# Patient Record
Sex: Female | Born: 1965 | Race: Black or African American | Hispanic: No | Marital: Single | Smoking: Never smoker
Health system: Southern US, Community
[De-identification: ages and names within clinical notes are randomized; demographics above are authoritative.]

## PROBLEM LIST (undated history)

## (undated) DIAGNOSIS — N62 Hypertrophy of breast: Secondary | ICD-10-CM

## (undated) DIAGNOSIS — IMO0002 Reserved for concepts with insufficient information to code with codable children: Secondary | ICD-10-CM

## (undated) DIAGNOSIS — Z973 Presence of spectacles and contact lenses: Secondary | ICD-10-CM

## (undated) DIAGNOSIS — Z87410 Personal history of cervical dysplasia: Secondary | ICD-10-CM

## (undated) DIAGNOSIS — I10 Essential (primary) hypertension: Secondary | ICD-10-CM

## (undated) DIAGNOSIS — A749 Chlamydial infection, unspecified: Secondary | ICD-10-CM

## (undated) DIAGNOSIS — N891 Moderate vaginal dysplasia: Secondary | ICD-10-CM

## (undated) DIAGNOSIS — N840 Polyp of corpus uteri: Secondary | ICD-10-CM

## (undated) DIAGNOSIS — Z8742 Personal history of other diseases of the female genital tract: Secondary | ICD-10-CM

## (undated) DIAGNOSIS — N939 Abnormal uterine and vaginal bleeding, unspecified: Secondary | ICD-10-CM

## (undated) DIAGNOSIS — T7840XA Allergy, unspecified, initial encounter: Secondary | ICD-10-CM

## (undated) DIAGNOSIS — E785 Hyperlipidemia, unspecified: Secondary | ICD-10-CM

## (undated) HISTORY — DX: Allergy, unspecified, initial encounter: T78.40XA

## (undated) HISTORY — DX: Moderate vaginal dysplasia: N89.1

## (undated) HISTORY — PX: TUBAL LIGATION: SHX77

## (undated) HISTORY — DX: Hyperlipidemia, unspecified: E78.5

## (undated) HISTORY — DX: Essential (primary) hypertension: I10

## (undated) HISTORY — PX: OTHER SURGICAL HISTORY: SHX169

## (undated) HISTORY — PX: ENDOMETRIAL BIOPSY: PRO73

## (undated) HISTORY — DX: Reserved for concepts with insufficient information to code with codable children: IMO0002

## (undated) HISTORY — PX: COLONOSCOPY: SHX174

## (undated) HISTORY — DX: Chlamydial infection, unspecified: A74.9

---

## 1997-09-23 HISTORY — PX: TUBAL LIGATION: SHX77

## 1998-03-01 ENCOUNTER — Other Ambulatory Visit: Admission: RE | Admit: 1998-03-01 | Discharge: 1998-03-01 | Payer: Self-pay | Admitting: Obstetrics & Gynecology

## 1998-05-15 ENCOUNTER — Inpatient Hospital Stay (HOSPITAL_COMMUNITY): Admission: AD | Admit: 1998-05-15 | Discharge: 1998-05-15 | Payer: Self-pay | Admitting: Obstetrics & Gynecology

## 1998-09-11 ENCOUNTER — Inpatient Hospital Stay (HOSPITAL_COMMUNITY): Admission: AD | Admit: 1998-09-11 | Discharge: 1998-09-13 | Payer: Self-pay | Admitting: Obstetrics and Gynecology

## 2005-09-23 DIAGNOSIS — I10 Essential (primary) hypertension: Secondary | ICD-10-CM

## 2005-09-23 HISTORY — DX: Essential (primary) hypertension: I10

## 2008-01-18 ENCOUNTER — Encounter: Admission: RE | Admit: 2008-01-18 | Discharge: 2008-01-18 | Payer: Self-pay | Admitting: Family Medicine

## 2008-01-29 ENCOUNTER — Encounter: Admission: RE | Admit: 2008-01-29 | Discharge: 2008-01-29 | Payer: Self-pay | Admitting: Family Medicine

## 2009-02-27 ENCOUNTER — Encounter: Admission: RE | Admit: 2009-02-27 | Discharge: 2009-02-27 | Payer: Self-pay | Admitting: Family Medicine

## 2010-06-25 ENCOUNTER — Encounter: Admission: RE | Admit: 2010-06-25 | Discharge: 2010-06-25 | Payer: Self-pay | Admitting: Family Medicine

## 2010-09-23 DIAGNOSIS — Z8619 Personal history of other infectious and parasitic diseases: Secondary | ICD-10-CM

## 2010-09-23 DIAGNOSIS — A749 Chlamydial infection, unspecified: Secondary | ICD-10-CM

## 2010-09-23 HISTORY — DX: Personal history of other infectious and parasitic diseases: Z86.19

## 2010-09-23 HISTORY — DX: Chlamydial infection, unspecified: A74.9

## 2010-10-14 ENCOUNTER — Encounter: Payer: Self-pay | Admitting: Family Medicine

## 2011-05-25 DIAGNOSIS — IMO0002 Reserved for concepts with insufficient information to code with codable children: Secondary | ICD-10-CM

## 2011-05-25 HISTORY — DX: Reserved for concepts with insufficient information to code with codable children: IMO0002

## 2011-06-05 ENCOUNTER — Ambulatory Visit (AMBULATORY_SURGERY_CENTER): Payer: 59 | Admitting: *Deleted

## 2011-06-05 VITALS — Ht 65.5 in | Wt 226.0 lb

## 2011-06-05 DIAGNOSIS — Z1211 Encounter for screening for malignant neoplasm of colon: Secondary | ICD-10-CM

## 2011-06-05 MED ORDER — PEG-KCL-NACL-NASULF-NA ASC-C 100 G PO SOLR: ORAL | Status: DC

## 2011-06-06 ENCOUNTER — Encounter: Payer: Self-pay | Admitting: Gastroenterology

## 2011-06-06 ENCOUNTER — Other Ambulatory Visit: Payer: Self-pay | Admitting: Family Medicine

## 2011-06-06 ENCOUNTER — Other Ambulatory Visit: Payer: Self-pay | Admitting: Internal Medicine

## 2011-06-06 DIAGNOSIS — Z1231 Encounter for screening mammogram for malignant neoplasm of breast: Secondary | ICD-10-CM

## 2011-06-19 ENCOUNTER — Encounter: Payer: Self-pay | Admitting: Gastroenterology

## 2011-06-19 ENCOUNTER — Ambulatory Visit (AMBULATORY_SURGERY_CENTER): Payer: 59 | Admitting: Gastroenterology

## 2011-06-19 DIAGNOSIS — Z800 Family history of malignant neoplasm of digestive organs: Secondary | ICD-10-CM

## 2011-06-19 DIAGNOSIS — Z1211 Encounter for screening for malignant neoplasm of colon: Secondary | ICD-10-CM

## 2011-06-19 MED ORDER — SODIUM CHLORIDE 0.9 % IV SOLN: 500.0000 mL | INTRAVENOUS | Status: DC

## 2011-06-19 NOTE — Patient Instructions (Addendum)
FOLLOW DISCHARGE INSTRUCTIONS (BLUE & GREEN SHEETS).  Repeat colonoscopy 10 yrs 

## 2011-06-20 ENCOUNTER — Telehealth: Payer: Self-pay | Admitting: *Deleted

## 2011-06-20 NOTE — Telephone Encounter (Signed)
No ID on voicemail; no message left 

## 2011-07-01 ENCOUNTER — Ambulatory Visit
Admission: RE | Admit: 2011-07-01 | Discharge: 2011-07-01 | Disposition: A | Discharge status: Home / Self Care | Payer: 59 | Source: Ambulatory Visit | Attending: Internal Medicine | Admitting: Internal Medicine

## 2011-07-01 DIAGNOSIS — Z1231 Encounter for screening mammogram for malignant neoplasm of breast: Secondary | ICD-10-CM

## 2011-09-24 HISTORY — PX: COLONOSCOPY: SHX174

## 2012-04-21 ENCOUNTER — Ambulatory Visit (INDEPENDENT_AMBULATORY_CARE_PROVIDER_SITE_OTHER): Payer: 59 | Admitting: Internal Medicine

## 2012-04-21 VITALS — BP 120/82 | HR 90 | Temp 98.2°F | Resp 16 | Ht 66.5 in | Wt 216.2 lb

## 2012-04-21 DIAGNOSIS — H11439 Conjunctival hyperemia, unspecified eye: Secondary | ICD-10-CM

## 2012-04-21 DIAGNOSIS — R05 Cough: Secondary | ICD-10-CM

## 2012-04-21 DIAGNOSIS — J329 Chronic sinusitis, unspecified: Secondary | ICD-10-CM

## 2012-04-21 DIAGNOSIS — J019 Acute sinusitis, unspecified: Secondary | ICD-10-CM

## 2012-04-21 MED ORDER — HYDROCODONE-HOMATROPINE 5-1.5 MG/5ML PO SYRP: 5.0000 mL | ORAL_SOLUTION | Freq: Four times a day (QID) | ORAL | Status: AC | PRN

## 2012-04-21 MED ORDER — FLUTICASONE PROPIONATE 50 MCG/ACT NA SUSP: 2.0000 | Freq: Every day | NASAL | Status: DC

## 2012-04-21 MED ORDER — AMOXICILLIN 500 MG PO CAPS: 1000.0000 mg | ORAL_CAPSULE | Freq: Two times a day (BID) | ORAL | Status: DC

## 2012-04-21 NOTE — Progress Notes (Signed)
  Subjective:    Patient ID: Dana Reese, female    DOB: [birth year], 46 y.o.   MRN: [id number]  HPI, Nasal congestion with purulent discharge, sore throat, cough occasionally productive, and injected eyes for the last week No fever/no history of allergic rhinitis/eyes have been injected off and on for 3-4 months 10 sleep due to cough    Review of Systems     Objective:   Physical Exam No acute distress  vital signs stable/Has lost 10 pounds Bilateral conjunctival injection Purulent discharge with boggy turbinates Throat clear Lungs clear       Assessment & Plan:  Problem #1 sinusitis with cough  Amoxicillin plus Hycodan  Problem #2 conjunctival injection Flonase Followup as not resolved in 2-4 weeks

## 2012-06-23 HISTORY — PX: OTHER SURGICAL HISTORY: SHX169

## 2012-07-09 ENCOUNTER — Other Ambulatory Visit: Payer: Self-pay | Admitting: Internal Medicine

## 2012-07-09 DIAGNOSIS — Z1231 Encounter for screening mammogram for malignant neoplasm of breast: Secondary | ICD-10-CM

## 2012-07-20 ENCOUNTER — Ambulatory Visit
Admission: RE | Admit: 2012-07-20 | Discharge: 2012-07-20 | Disposition: A | Discharge status: Home / Self Care | Payer: 59 | Source: Ambulatory Visit | Attending: Internal Medicine | Admitting: Internal Medicine

## 2012-07-20 DIAGNOSIS — Z1231 Encounter for screening mammogram for malignant neoplasm of breast: Secondary | ICD-10-CM

## 2012-08-10 ENCOUNTER — Ambulatory Visit: Payer: 59

## 2012-10-24 ENCOUNTER — Ambulatory Visit (INDEPENDENT_AMBULATORY_CARE_PROVIDER_SITE_OTHER): Payer: 59 | Admitting: Family Medicine

## 2012-10-24 VITALS — BP 196/115 | HR 119 | Temp 98.6°F | Resp 17 | Ht 66.5 in | Wt 210.0 lb

## 2012-10-24 DIAGNOSIS — Z01419 Encounter for gynecological examination (general) (routine) without abnormal findings: Secondary | ICD-10-CM

## 2012-10-24 DIAGNOSIS — Z0000 Encounter for general adult medical examination without abnormal findings: Secondary | ICD-10-CM | POA: Insufficient documentation

## 2012-10-24 DIAGNOSIS — Z7251 High risk heterosexual behavior: Secondary | ICD-10-CM

## 2012-10-24 DIAGNOSIS — I10 Essential (primary) hypertension: Secondary | ICD-10-CM | POA: Insufficient documentation

## 2012-10-24 DIAGNOSIS — Z800 Family history of malignant neoplasm of digestive organs: Secondary | ICD-10-CM

## 2012-10-24 DIAGNOSIS — Z803 Family history of malignant neoplasm of breast: Secondary | ICD-10-CM

## 2012-10-24 LAB — POCT CBC
Lymph, poc: 1.9 (ref 0.6–3.4)
MCH, POC: 30.6 pg (ref 27–31.2)
MCHC: 32.8 g/dL (ref 31.8–35.4)
MID (cbc): 0.3 (ref 0–0.9)
MPV: 12 fL (ref 0–99.8)
POC LYMPH PERCENT: 36.1 %L (ref 10–50)
POC MID %: 5.2 %M (ref 0–12)
Platelet Count, POC: 260 10*3/uL (ref 142–424)
RBC: 4.35 M/uL (ref 4.04–5.48)
RDW, POC: 14.7 %
WBC: 5.2 10*3/uL (ref 4.6–10.2)

## 2012-10-24 LAB — POCT URINALYSIS DIPSTICK
Blood, UA: NEGATIVE
Protein, UA: NEGATIVE
Spec Grav, UA: 1.01
Urobilinogen, UA: 0.2
pH, UA: 7

## 2012-10-24 MED ORDER — LISINOPRIL-HYDROCHLOROTHIAZIDE 10-12.5 MG PO TABS: 1.0000 | ORAL_TABLET | Freq: Every day | ORAL | Status: DC

## 2012-10-24 NOTE — Assessment & Plan Note (Signed)
Anticipatory guidance --- exercise, weight loss.  Pap smear obtained.  Mammogram and colonoscopy UTD.  Immunizations UTD.  Obtain labs.

## 2012-10-24 NOTE — Progress Notes (Signed)
Reviewed and agree.

## 2012-10-24 NOTE — Assessment & Plan Note (Signed)
Completed. Pap smear obtained; mammogram UTD.

## 2012-10-24 NOTE — Assessment & Plan Note (Signed)
Obtain labs. Repeat Gc/Chlam.

## 2012-10-24 NOTE — Addendum Note (Signed)
Addended by: Johnnette Litter on: 10/24/2012 01:13 PM   Modules accepted: Orders

## 2012-10-24 NOTE — Patient Instructions (Addendum)
1. Routine general medical examination at a health care facility  POCT CBC, POCT glycosylated hemoglobin (Hb A1C), POCT urinalysis dipstick, Comprehensive metabolic panel, Lipid panel, TSH, Vitamin B12, Vitamin D 25 hydroxy, EKG 12-Lead  2. Routine gynecological examination  Pap IG, CT/NG NAA, and HPV (high risk)  3. Problems related to high-risk sexual behavior  HIV antibody, RPR  4. Essential hypertension, benign  POCT urinalysis dipstick, Comprehensive metabolic panel, Lipid panel, TSH, EKG 12-Lead     RECOMMEND FOLLOW-UP WITH DR. Merla Riches IN UPCOMING 1-3 MONTHS FOR FOLLOW-UP ON BLOOD PRESSURE.

## 2012-10-24 NOTE — Assessment & Plan Note (Signed)
Uncontrolled due to medical non-compliance; refill provided. Recommend follow-up in 1-3 months. Asymptomatic currently.

## 2012-10-24 NOTE — Assessment & Plan Note (Signed)
Mammogram UTD. 

## 2012-10-24 NOTE — Assessment & Plan Note (Signed)
Colonoscopy UTD; warrants colonoscopies every five years.

## 2012-10-24 NOTE — Progress Notes (Signed)
[address]   [place], [place]  [address]   [phone]  Subjective:    Patient ID: Dana Reese, female    DOB: 1966-04-10, 47 y.o.   MRN: [id number]  HPI This 47 y.o. female presents for evaluation of CPE.  Last physical 05/2011 [facility]. Pap smear 05/2011. ASCUS HPV+  Chlamydia+.   Mammogram 06/2012. Colonoscopy 05/2011. TDAP 11/2008 Influenza 2013 at work. Eye exam 2012. Dental exam recently 06/2012. Bone density scan never.  Regular menses (3-5 days, moderate flow; no cramping).    Review of Systems  Constitutional: Negative for fever, chills, diaphoresis, activity change, appetite change, fatigue and unexpected weight change.  HENT: Negative for hearing loss, ear pain, nosebleeds, congestion, sore throat, facial swelling, rhinorrhea, sneezing, drooling, mouth sores, trouble swallowing, neck pain, neck stiffness, dental problem, voice change, postnasal drip, sinus pressure, tinnitus and ear discharge.   Eyes: Negative for photophobia, pain, discharge, redness, itching and visual disturbance.  Respiratory: Negative for apnea, cough, choking, chest tightness, shortness of breath, wheezing and stridor.   Cardiovascular: Negative for chest pain, palpitations and leg swelling.  Gastrointestinal: Negative for nausea, vomiting, abdominal pain, diarrhea, constipation, blood in stool, abdominal distention, anal bleeding and rectal pain.  Genitourinary: Negative for dysuria, urgency, frequency, hematuria, flank pain, decreased urine volume, vaginal bleeding, vaginal discharge, enuresis, difficulty urinating, genital sores, vaginal pain, menstrual problem, pelvic pain and dyspareunia.  Musculoskeletal: Negative for myalgias, back pain, joint swelling, arthralgias and gait problem.  Skin: Negative for color change, pallor, rash and wound.  Neurological: Negative for dizziness, tremors, seizures, syncope, facial asymmetry, speech difficulty, weakness, light-headedness, numbness and headaches.    Hematological: Negative for adenopathy. Does not bruise/bleed easily.  Psychiatric/Behavioral: Negative for suicidal ideas, hallucinations, behavioral problems, confusion, sleep disturbance, self-injury, dysphoric mood, decreased concentration and agitation. The patient is not nervous/anxious and is not hyperactive.         Past Medical History  Diagnosis Date  . Hypertension 09/23/2005    Past Surgical History  Procedure Date  . Tubal ligation   . Colonoscopy 06/23/2012    normal; repeat in 10 years; [facility].    Prior to Admission medications   Medication Sig Start Date End Date Taking? Authorizing Provider  amoxicillin (AMOXIL) 500 MG capsule Take 2 capsules (1,000 mg total) by mouth 2 (two) times daily. 04/21/12   Tonye Pearson, MD  Ascorbic Acid (VITAMIN C) 1000 MG tablet Take 1,000 mg by mouth daily.      Historical Provider, MD  fluticasone (FLONASE) 50 MCG/ACT nasal spray Place 2 sprays into the nose daily. 04/21/12 04/21/13  Tonye Pearson, MD  lisinopril (PRINIVIL,ZESTRIL) 10 MG tablet Take 10 mg by mouth daily.      Historical Provider, MD  metroNIDAZOLE (FLAGYL) 500 MG tablet Take 1 tablet by mouth Twice daily. 06/03/11   Historical Provider, MD  peg 3350 powder (MOVIPREP) 100 G SOLR MOVI PREP take as directed 06/05/11   Mardella Layman, MD    No Known Allergies  History   Social History  . Marital Status: Single    Spouse Name: N/A    Number of Children: N/A  . Years of Education: N/A   Occupational History  . Not on file.   Social History Main Topics  . Smoking status: Never Smoker   . Smokeless tobacco: Never Used  . Alcohol Use: No  . Drug Use: No  . Sexually Active: Not on file   Other Topics Concern  . Not on file  Social History Narrative   Marital status: single; dating seriously x 5 years; happy; no abuse   Children: 2 (29, 14); no grandchildren.   Lives: with two daughters.   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   Tobacco: none     Alcohol: socially   Drugs: none   Exercise:  Walking 5 days per week; 30 minutes; 2 miles.   Sexually activity: yes; history of Chlamydia 2012.     Seatbelt: 100% of time.   Guns: none    Family History  Problem Relation Age of Onset  . Colon cancer Mother   . Cancer Mother     Breast cancer age 77; colon cancer age 17; bladder cancer age 35.  Marland Kitchen Hypertension Mother   . Hyperlipidemia Mother   . Cancer Maternal Grandmother   . Heart disease Maternal Grandfather   . Cancer Father 23    colon cancer.    Objective:   Physical Exam  Nursing note and vitals reviewed. Constitutional: She is oriented to person, place, and time. She appears well-developed and well-nourished. No distress.  HENT:  Head: Normocephalic and atraumatic.  Right Ear: External ear normal.  Left Ear: External ear normal.  Nose: Nose normal.  Mouth/Throat: Oropharynx is clear and moist.  Eyes: Conjunctivae normal and EOM are normal. Pupils are equal, round, and reactive to light.  Neck: Normal range of motion. Neck supple. No JVD present. No thyromegaly present.  Cardiovascular: Normal rate, regular rhythm, normal heart sounds and intact distal pulses.  Exam reveals no gallop and no friction rub.   No murmur heard. Pulmonary/Chest: Effort normal and breath sounds normal. She has no wheezes. She has no rales.  Abdominal: Soft. Bowel sounds are normal. She exhibits no distension. There is no tenderness. There is no rebound and no guarding. Hernia confirmed negative in the right inguinal area and confirmed negative in the left inguinal area.  Genitourinary: Vagina normal and uterus normal. No breast swelling, tenderness, discharge or bleeding. There is no rash, tenderness or lesion on the right labia. There is no rash, tenderness or lesion on the left labia. Cervix exhibits no motion tenderness, no discharge and no friability. Right adnexum displays no mass, no tenderness and no fullness. Left adnexum displays no mass, no  tenderness and no fullness. No vaginal discharge found.  Musculoskeletal:       Right shoulder: Normal.       Left shoulder: Normal.       Cervical back: Normal.  Lymphadenopathy:    She has no cervical adenopathy.       Right: No inguinal adenopathy present.       Left: No inguinal adenopathy present.  Neurological: She is alert and oriented to person, place, and time. She has normal reflexes. No cranial nerve deficit. She exhibits normal muscle tone. Coordination normal.  Skin: Skin is warm and dry. No rash noted. She is not diaphoretic.  Psychiatric: She has a normal mood and affect. Her behavior is normal. Judgment and thought content normal.    EKG: NSR; NO ST CHANGES.      Assessment & Plan:

## 2012-10-27 LAB — COMPREHENSIVE METABOLIC PANEL
Albumin/Globulin Ratio: 1.5 (ref 1.1–2.5)
Albumin: 4.4 g/dL (ref 3.5–5.5)
BUN: 7 mg/dL (ref 6–24)
GFR calc Af Amer: 97 mL/min/{1.73_m2} (ref >59)
GFR calc non Af Amer: 84 mL/min/{1.73_m2} (ref >59)
Globulin, Total: 3 g/dL (ref 1.5–4.5)
Glucose: 85 mg/dL (ref 65–99)
Total Bilirubin: 0.7 mg/dL (ref 0.0–1.2)
Total Protein: 7.4 g/dL (ref 6.0–8.5)

## 2012-10-27 LAB — LIPID PANEL
Chol/HDL Ratio: 4.1 ratio units (ref 0.0–4.4)
HDL: 53 mg/dL (ref >39)
LDL Calculated: 148 mg/dL — ABNORMAL HIGH (ref 0–99)
Triglycerides: 78 mg/dL (ref 0–149)
VLDL Cholesterol Cal: 16 mg/dL (ref 5–40)

## 2012-10-27 LAB — VITAMIN D 25 HYDROXY (VIT D DEFICIENCY, FRACTURES): Vit D, 25-Hydroxy: 10.6 ng/mL — ABNORMAL LOW (ref 30.0–100.0)

## 2012-10-27 LAB — HIV ANTIBODY (ROUTINE TESTING W REFLEX): HIV 1/O/2 Abs-Index Value: <1 (ref <1.00)

## 2012-10-28 LAB — PAP IG, CT-NG NAA, HPV HIGH-RISK
Chlamydia, Nuc. Acid Amp: NEGATIVE
HPV, high-risk: NEGATIVE
PAP Smear Comment: 0

## 2013-02-05 ENCOUNTER — Ambulatory Visit (INDEPENDENT_AMBULATORY_CARE_PROVIDER_SITE_OTHER): Payer: 59 | Admitting: Family Medicine

## 2013-02-05 VITALS — BP 134/90 | HR 80 | Temp 98.4°F | Resp 18 | Ht 66.0 in | Wt 208.6 lb

## 2013-02-05 DIAGNOSIS — I10 Essential (primary) hypertension: Secondary | ICD-10-CM

## 2013-02-05 MED ORDER — LISINOPRIL-HYDROCHLOROTHIAZIDE 10-12.5 MG PO TABS: 1.0000 | ORAL_TABLET | Freq: Every day | ORAL | Status: DC

## 2013-02-05 NOTE — Patient Instructions (Signed)

## 2013-02-05 NOTE — Progress Notes (Signed)
Is a 47 year old woman works for [employer] in [place]. She comes in for refill on her blood pressure medicine. She's been treated for hypertension for 7 years and has no new complaints today. She denies chest pain shortness of breath, leg swelling. She does have a dry cough intermittently at night but this is not bothering her sleep.  Patient has a family history of hypertension.  Patient attributes much of her hypertension to weight gain 7 years ago and she's currently working on losing some the extra weight now.  Objective: Very pleasant woman who is overweight, no acute distress, appropriate with good eye contact HEENT: Unremarkable Neck: Supple no adenopathy Chest: Clear Heart: Regular no murmur Extremities: No edema  Assessment: Hypertension controlled with current medication  Plan: Renew medicines, check electrolytes  Dana Sidle, MD

## 2013-02-09 LAB — COMPREHENSIVE METABOLIC PANEL
ALT: 4 IU/L (ref 0–32)
AST: 9 IU/L (ref 0–40)
Albumin/Globulin Ratio: 1.4 (ref 1.1–2.5)
Albumin: 4.1 g/dL (ref 3.5–5.5)
Alkaline Phosphatase: 61 IU/L (ref 39–117)
BUN/Creatinine Ratio: 13 (ref 9–23)
BUN: 11 mg/dL (ref 6–24)
CO2: 25 mmol/L (ref 19–28)
Calcium: 9.7 mg/dL (ref 8.7–10.2)
Chloride: 101 mmol/L (ref 97–108)
Creatinine, Ser: 0.88 mg/dL (ref 0.57–1.00)
GFR calc Af Amer: 90 mL/min/{1.73_m2} (ref >59)
GFR calc non Af Amer: 78 mL/min/{1.73_m2} (ref >59)
Globulin, Total: 3 g/dL (ref 1.5–4.5)
Glucose: 88 mg/dL (ref 65–99)
Potassium: 4.2 mmol/L (ref 3.5–5.2)
Sodium: 138 mmol/L (ref 134–144)
Total Bilirubin: 0.3 mg/dL (ref 0.0–1.2)
Total Protein: 7.1 g/dL (ref 6.0–8.5)

## 2013-08-25 ENCOUNTER — Other Ambulatory Visit: Payer: Self-pay

## 2013-08-25 DIAGNOSIS — Z1231 Encounter for screening mammogram for malignant neoplasm of breast: Secondary | ICD-10-CM

## 2013-09-27 ENCOUNTER — Ambulatory Visit
Admission: RE | Admit: 2013-09-27 | Discharge: 2013-09-27 | Disposition: A | Discharge status: Home / Self Care | Payer: 59 | Source: Ambulatory Visit

## 2013-09-27 DIAGNOSIS — Z1231 Encounter for screening mammogram for malignant neoplasm of breast: Secondary | ICD-10-CM

## 2014-03-16 ENCOUNTER — Other Ambulatory Visit: Payer: Self-pay | Admitting: Family Medicine

## 2014-04-26 ENCOUNTER — Ambulatory Visit (INDEPENDENT_AMBULATORY_CARE_PROVIDER_SITE_OTHER): Payer: 59 | Admitting: Family Medicine

## 2014-04-26 ENCOUNTER — Other Ambulatory Visit: Payer: Self-pay | Admitting: Family Medicine

## 2014-04-26 VITALS — BP 126/80 | HR 64 | Temp 98.0°F | Resp 16 | Ht 66.0 in | Wt 208.0 lb

## 2014-04-26 DIAGNOSIS — Z0000 Encounter for general adult medical examination without abnormal findings: Secondary | ICD-10-CM

## 2014-04-26 DIAGNOSIS — R5381 Other malaise: Secondary | ICD-10-CM

## 2014-04-26 DIAGNOSIS — Z733 Stress, not elsewhere classified: Secondary | ICD-10-CM

## 2014-04-26 DIAGNOSIS — R7989 Other specified abnormal findings of blood chemistry: Secondary | ICD-10-CM

## 2014-04-26 DIAGNOSIS — F439 Reaction to severe stress, unspecified: Secondary | ICD-10-CM

## 2014-04-26 DIAGNOSIS — H539 Unspecified visual disturbance: Secondary | ICD-10-CM

## 2014-04-26 DIAGNOSIS — R5383 Other fatigue: Secondary | ICD-10-CM

## 2014-04-26 DIAGNOSIS — I10 Essential (primary) hypertension: Secondary | ICD-10-CM

## 2014-04-26 LAB — POCT CBC
GRANULOCYTE PERCENT: 68.6 % (ref 37–80)
HCT, POC: 36.2 % — AB (ref 37.7–47.9)
Hemoglobin: 12 g/dL — AB (ref 12.2–16.2)
Lymph, poc: 1.7 (ref 0.6–3.4)
MCH: 31.5 pg — AB (ref 27–31.2)
MCHC: 33.1 g/dL (ref 31.8–35.4)
MCV: 95.2 fL (ref 80–97)
MID (CBC): 0.4 (ref 0–0.9)
MPV: 9 fL (ref 0–99.8)
PLATELET COUNT, POC: 220 10*3/uL (ref 142–424)
POC Granulocyte: 4.7 (ref 2–6.9)
POC LYMPH %: 25.2 % (ref 10–50)
POC MID %: 6.2 % (ref 0–12)
RBC: 3.8 M/uL — AB (ref 4.04–5.48)
RDW, POC: 17.5 %
WBC: 6.8 10*3/uL (ref 4.6–10.2)

## 2014-04-26 LAB — COMPREHENSIVE METABOLIC PANEL
AST: 14 U/L (ref 0–37)
Albumin: 3.9 g/dL (ref 3.5–5.2)
Alkaline Phosphatase: 51 U/L (ref 39–117)
BILIRUBIN TOTAL: 0.6 mg/dL (ref 0.2–1.2)
BUN: 12 mg/dL (ref 6–23)
CO2: 26 mEq/L (ref 19–32)
Calcium: 9.5 mg/dL (ref 8.4–10.5)
Chloride: 105 mEq/L (ref 96–112)
Creat: 0.89 mg/dL (ref 0.50–1.10)
Glucose, Bld: 77 mg/dL (ref 70–99)
Potassium: 3.5 mEq/L (ref 3.5–5.3)
SODIUM: 139 meq/L (ref 135–145)
TOTAL PROTEIN: 6.8 g/dL (ref 6.0–8.3)

## 2014-04-26 LAB — LIPID PANEL
CHOL/HDL RATIO: 3.5 ratio
Cholesterol: 208 mg/dL — ABNORMAL HIGH (ref 0–200)
HDL: 60 mg/dL (ref >39)
LDL CALC: 139 mg/dL — AB (ref 0–99)
Triglycerides: 47 mg/dL (ref <150)
VLDL: 9 mg/dL (ref 0–40)

## 2014-04-26 LAB — TSH: TSH: 1.783 u[IU]/mL (ref 0.350–4.500)

## 2014-04-26 LAB — VITAMIN D 25 HYDROXY (VIT D DEFICIENCY, FRACTURES): VIT D 25 HYDROXY: 62 ng/mL (ref 30–89)

## 2014-04-26 MED ORDER — LISINOPRIL-HYDROCHLOROTHIAZIDE 10-12.5 MG PO TABS: 1.0000 | ORAL_TABLET | Freq: Every day | ORAL | Status: DC

## 2014-04-26 NOTE — Progress Notes (Addendum)
Subjective:    Patient ID: Dana Reese, female    DOB: Jun 20, 1966, 48 y.o.   MRN: [id number] This chart was scribed for Shade Flood, MD by Julian Hy, ED Scribe. The patient was seen in Room 12. The patient's care was started at 9:33 AM.   Chief Complaint  Patient presents with  . Annual Exam    with pap     HPI HPI Comments: Dana Reese is a 48 y.o. female who presents to the [facility] for annual exam. Pt denies any trouble with urination. Pt reports she has been feeling depressed related to her mother's breast cancer. Pt reports her mother is having major medical issues due to her breast cancer and states there are financial stressors.denies anhedonia.   Pt reports all labs need to go to [facility]. Pt reports she works for [employer]. Pt reports she is fasting.   Colon Cancer Screening Here for CPE, last physical with Dr. Katrinka Blazing in 2014. She had a colonoscopy in 05/2011 which is to be completed every 5 years due to family history of colon cancer. Colonoscopy in 2012 was normal without polyps. Per that note Dr. Jarold Motto recommended colonoscopy in 10 years with FOBT testing in 5 years.  Mammogram Last Mammogram was in December 2014 which was normal. Family history of breast cancer in mom at 6. Pt denies any other family history of MMG. Pt reports she does self-checks and has not recognized any breast differences. Pt denies having a MMG scheduled.   Cervical Cancer Screening Normal or negative pap in February 2014. Pt denies having any abnormal pap smears. Pt denies any new sexual contacts since her last pap smear. Pt states she had normal vaginal discharge. Pt denies vaginal pain.  Immunizations Tdap in March 2010.  Exercise Pt reports she is exercising a couple days of week. Pt reports she jogs/walks about 2 days a week. Pt recently ran a 5k.   Dentist Pt has seen a dentist in the past 6 months and has an appointment for next  week.  Opthalmologist Pt reports she needs to set-up an appointment and hasn't seen anyone in years. Pt reports she is having darkness around eyes. Visual problems, pt reports her vision is blurry. Pt denies rhinnorhea, congestion or allergy symptoms.  Hypertension She takes Zestoretic 10/12.5 mg q.d. Her last kidney function test was normal in May 2014 with creatinine of 0.88. Lipid panel one year ago, elevated with total 217. LDL 148. Recommended recheck at 6 months. Pt states is compliant with her medication. Pt denies she checks her BP at home. Pt denies chest pain, leg swelling or SOB.  Low Vitamin D 10.6 in February 2014. Recommended starting OTC Vitamin D 1000 Units/day. Pt reports she took her OTC Vitamin D for about 4-5 months, but has not taken any recently. Pt reports she is fatigued for 7-8 months. Pt reports working 50 hours/week. Pt reports she sleeps about 8 hours a night. Pt reports she has normal menstrual cycles. Denies abnormal stools, blood in stool.    Patient Active Problem List   Diagnosis Date Noted  . Routine general medical examination at a health care facility 10/24/2012  . Routine gynecological examination 10/24/2012  . Problems related to high-risk sexual behavior 10/24/2012  . Essential hypertension, benign 10/24/2012  . Family history of colon cancer 10/24/2012  . Family history of breast cancer in first degree relative 10/24/2012   Past Medical History  Diagnosis Date  . Hypertension  09/23/2005  . Chlamydia 09/23/2010  . ASCUS with positive high risk HPV 05/25/2011   Past Surgical History  Procedure Laterality Date  . Tubal ligation    . Colonoscopy  06/23/2012    normal; repeat in 10 years; [facility].   No Known Allergies Prior to Admission medications   Medication Sig Start Date End Date Taking? Authorizing Provider  lisinopril-hydrochlorothiazide (PRINZIDE,ZESTORETIC) 10-12.5 MG per tablet Take 1 tablet by mouth daily. PATIENT NEEDS OFFICE VISIT FOR  ADDITIONAL REFILLS   Yes Morrell Riddle, PA-C   History   Social History  . Marital Status: Single    Spouse Name: N/A    Number of Children: N/A  . Years of Education: N/A   Occupational History  . Not on file.   Social History Main Topics  . Smoking status: Never Smoker   . Smokeless tobacco: Never Used  . Alcohol Use: No  . Drug Use: No  . Sexual Activity: Yes    Partners: Male    Birth Control/ Protection: Surgical   Other Topics Concern  . Not on file   Social History Narrative   Marital status: single; dating seriously x 5 years; happy; no abuse      Children: 2 (29, 14); no grandchildren.      Lives: with two daughters.      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      Tobacco: none       Alcohol: socially      Drugs: none      Exercise:  Walking 5 days per week; 30 minutes; 2 miles.      Sexually activity: yes; history of Chlamydia 2012.        Seatbelt: 100% of time.      Guns: none    Review of Systems  Constitutional: Positive for fatigue. Negative for unexpected weight change.  Respiratory: Negative for chest tightness and shortness of breath.   Cardiovascular: Negative for chest pain, palpitations and leg swelling.  Gastrointestinal: Negative for abdominal pain and blood in stool.  Genitourinary: Negative for dysuria, vaginal discharge, difficulty urinating and vaginal pain.  Neurological: Negative for dizziness, syncope, light-headedness and headaches.  All other systems reviewed and are negative.  13 point ROS per pt survey reviewed - notated as above or in HPI.     Objective:   Physical Exam  Nursing note and vitals reviewed. Constitutional: She is oriented to person, place, and time. She appears well-developed and well-nourished. No distress.  HENT:  Head: Normocephalic and atraumatic.  Right Ear: External ear normal.  Left Ear: External ear normal.  Mouth/Throat: Oropharynx is clear and moist.  Slight shawdowing underneath the eyes  bilaterally. Eye exam otherwise normal. No swelling of the turbinates.   Eyes: Conjunctivae and EOM are normal. Pupils are equal, round, and reactive to light.  Neck: Normal range of motion. Neck supple. No tracheal deviation present. No thyromegaly present.  Cardiovascular: Normal rate, regular rhythm, normal heart sounds and intact distal pulses.   No murmur heard. Pulmonary/Chest: Effort normal and breath sounds normal. No respiratory distress. She has no wheezes. Right breast exhibits no mass and no skin change. Left breast exhibits no mass and no nipple discharge. Breasts are symmetrical.  Abdominal: Soft. Bowel sounds are normal. There is no tenderness.  Musculoskeletal: Normal range of motion. She exhibits no edema and no tenderness.  Lymphadenopathy:    She has no cervical adenopathy.  Neurological: She is alert and oriented to person, place, and time.  Skin: Skin is warm and dry. No rash noted.  Psychiatric: She has a normal mood and affect. Her behavior is normal. Thought content normal.  No anhedonia.   Filed Vitals:   04/26/14 0854  BP: 126/80  Pulse: 64  Temp: 98 F (36.7 C)  TempSrc: Oral  Resp: 16  Height: 5\' 6"  (1.676 m)  Weight: 208 lb (94.348 kg)  SpO2: 100%    Visual Acuity Screening   Right eye Left eye Both eyes  Without correction: 20/50 20/50 20/40   With correction:       Results for orders placed in visit on 04/26/14  POCT CBC      Result Value Ref Range   WBC 6.8  4.6 - 10.2 K/uL   Lymph, poc 1.7  0.6 - 3.4   POC LYMPH PERCENT 25.2  10 - 50 %L   MID (cbc) 0.4  0 - 0.9   POC MID % 6.2  0 - 12 %M   POC Granulocyte 4.7  2 - 6.9   Granulocyte percent 68.6  37 - 80 %G   RBC 3.80 (*) 4.04 - 5.48 M/uL   Hemoglobin 12.0 (*) 12.2 - 16.2 g/dL   HCT, POC 16.1 (*) 09.6 - 47.9 %   MCV 95.2  80 - 97 fL   MCH, POC 31.5 (*) 27 - 31.2 pg   MCHC 33.1  31.8 - 35.4 g/dL   RDW, POC 04.5     Platelet Count, POC 220  142 - 424 K/uL   MPV 9.0  0 - 99.8 fL     Assessment & Plan:  Dana Reese is a 47 y.o. female Routine general medical examination at a health care facility - Plan: POCT CBC, Comprehensive metabolic panel, Lipid panel, TSH, Vit D  25 hydroxy (rtn osteoporosis monitoring)  --anticipatory guidance as below in AVS, screening labs above. Health maintenance items as above in HPI discussed/recommended as applicable. Essential hypertension  -stable. No med changes.   Low serum vitamin D - Plan: Vit D  25 hydroxy (rtn osteoporosis monitoring)  -Prior very low vit D, will recheck and discuss Rx if needed.   Other malaise and fatigue - Plan: POCT CBC, TSH  - stress related with family stressors possible, but prior low vit D may also be contributory. Borderline HGB likely not cause. Labs above, and recheck in next 6 weeks for fatigue.   Vision changes  -decreased visual acuity - eval with optho - she will schedule.   Situational stress - Plan: POCT CBC, TSH  - adjustment d/o or situational stress.  Denies anhedonia, only occasional depressed mood. Exercise, stress mgt and discuss further at next ov - rtc sooner if incr depressed mood or worse sx's.   No orders of the defined types were placed in this encounter.   Patient Instructions  You should receive a call or letter about your lab results within the next week to 10 days.  Call eye care provider to obtain appointment for visit.  Return for further discussion of fatigue in the next 6 weeks, but can recheck labs initially.   Keeping You Healthy  Get These Tests 1. Blood Pressure- Have your blood pressure checked once a year by your health care provider.  Normal blood pressure is 120/80. 2. Weight- Have your body mass index (BMI) calculated to screen for obesity.  BMI is measure of body fat based on height and weight.  You can also calculate your own BMI at https://www.west-esparza.com/. 3. Cholesterol-  Have your cholesterol checked every 5 years starting at age 49 then yearly  starting at age 49. 4. Chlamydia, HIV, and other sexually transmitted diseases- Get screened every year until age 10925, then within three months of each new sexual provider. 5. Pap Smear- Every 1-3 years; discuss with your health care provider. 6. Mammogram- Every year starting at age 49  Take these medicines  Calcium with Vitamin D-Your body needs 1200 mg of Calcium each day and [phone] IU of Vitamin D daily.  Your body can only absorb 500 mg of Calcium at a time so Calcium must be taken in 2 or 3 divided doses throughout the day.  Multivitamin with folic acid- Once daily if it is possible for you to become pregnant.  Get these Immunizations  Gardasil-Series of three doses; prevents HPV related illness such as genital warts and cervical cancer.  Menactra-Single dose; prevents meningitis.  Tetanus shot- Every 10 years.  Flu shot-Every year.  Take these steps 1. Do not smoke-Your healthcare provider can help you quit.  For tips on how to quit go to www.smokefree.gov or call 1-800 QUITNOW. 2. Be physically active- Exercise 5 days a week for at least 30 minutes.  If you are not already physically active, start slow and gradually work up to 30 minutes of moderate physical activity.  Examples of moderate activity include walking briskly, dancing, swimming, bicycling, etc. 3. Breast Cancer- A self breast exam every month is important for early detection of breast cancer.  For more information and instruction on self breast exams, ask your healthcare provider or [place].eswww.womenshealth.gov/faq/breast-self-exam.cfm. 4. Eat a healthy diet- Eat a variety of healthy foods such as fruits, vegetables, whole grains, low fat milk, low fat cheeses, yogurt, lean meats, poultry and fish, beans, nuts, tofu, etc.  For more information go to www. Thenutritionsource.org 5. Drink alcohol in moderation- Limit alcohol intake to one drink or less per day. Never drink and drive. 6. Depression- Your emotional health is as  important as your physical health.  If you're feeling down or losing interest in things you normally enjoy please talk to your healthcare provider about being screened for depression. 7. Dental visit- Brush and floss your teeth twice daily; visit your dentist twice a year. 8. Eye doctor- Get an eye exam at least every 2 years. 9. Helmet use- Always wear a helmet when riding a bicycle, motorcycle, rollerblading or skateboarding. 10. Safe sex- If you may be exposed to sexually transmitted infections, use a condom. 11. Seat belts- Seat belts can save your live; always wear one. 12. Smoke/Carbon Monoxide detectors- These detectors need to be installed on the appropriate level of your home. Replace batteries at least once a year. 13. Skin cancer- When out in the sun please cover up and use sunscreen 15 SPF or higher. 14. Violence- If anyone is threatening or hurting you, please tell your healthcare provider.           I personally performed the services described in this documentation, which was scribed in my presence. The recorded information has been reviewed and considered, and addended by me as needed.

## 2014-04-26 NOTE — Patient Instructions (Signed)
You should receive a call or letter about your lab results within the next week to 10 days.  Call eye care provider to obtain appointment for visit.  Return for further discussion of fatigue in the next 6 weeks, but can recheck labs initially.   Keeping You Healthy  Get These Tests 1. Blood Pressure- Have your blood pressure checked once a year by your health care provider.  Normal blood pressure is 120/80. 2. Weight- Have your body mass index (BMI) calculated to screen for obesity.  BMI is measure of body fat based on height and weight.  You can also calculate your own BMI at https://www.west-esparza.com/www.nhlbisupport.com/bmi/. 3. Cholesterol- Have your cholesterol checked every 5 years starting at age 49 then yearly starting at age 49. 4. Chlamydia, HIV, and other sexually transmitted diseases- Get screened every year until age 49, then within three months of each new sexual provider. 5. Pap Smear- Every 1-3 years; discuss with your health care provider. 6. Mammogram- Every year starting at age 49  Take these medicines  Calcium with Vitamin D-Your body needs 1200 mg of Calcium each day and [phone] IU of Vitamin D daily.  Your body can only absorb 500 mg of Calcium at a time so Calcium must be taken in 2 or 3 divided doses throughout the day.  Multivitamin with folic acid- Once daily if it is possible for you to become pregnant.  Get these Immunizations  Gardasil-Series of three doses; prevents HPV related illness such as genital warts and cervical cancer.  Menactra-Single dose; prevents meningitis.  Tetanus shot- Every 10 years.  Flu shot-Every year.  Take these steps 1. Do not smoke-Your healthcare provider can help you quit.  For tips on how to quit go to www.smokefree.gov or call 1-800 QUITNOW. 2. Be physically active- Exercise 5 days a week for at least 30 minutes.  If you are not already physically active, start slow and gradually work up to 30 minutes of moderate physical activity.  Examples of  moderate activity include walking briskly, dancing, swimming, bicycling, etc. 3. Breast Cancer- A self breast exam every month is important for early detection of breast cancer.  For more information and instruction on self breast exams, ask your healthcare provider or [place].eswww.womenshealth.gov/faq/breast-self-exam.cfm. 4. Eat a healthy diet- Eat a variety of healthy foods such as fruits, vegetables, whole grains, low fat milk, low fat cheeses, yogurt, lean meats, poultry and fish, beans, nuts, tofu, etc.  For more information go to www. Thenutritionsource.org 5. Drink alcohol in moderation- Limit alcohol intake to one drink or less per day. Never drink and drive. 6. Depression- Your emotional health is as important as your physical health.  If you're feeling down or losing interest in things you normally enjoy please talk to your healthcare provider about being screened for depression. 7. Dental visit- Brush and floss your teeth twice daily; visit your dentist twice a year. 8. Eye doctor- Get an eye exam at least every 2 years. 9. Helmet use- Always wear a helmet when riding a bicycle, motorcycle, rollerblading or skateboarding. 10. Safe sex- If you may be exposed to sexually transmitted infections, use a condom. 11. Seat belts- Seat belts can save your live; always wear one. 12. Smoke/Carbon Monoxide detectors- These detectors need to be installed on the appropriate level of your home. Replace batteries at least once a year. 13. Skin cancer- When out in the sun please cover up and use sunscreen 15 SPF or higher. 14. Violence- If anyone is threatening or  hurting you, please tell your healthcare provider.

## 2014-05-11 ENCOUNTER — Telehealth: Payer: Self-pay

## 2014-05-11 NOTE — Telephone Encounter (Signed)
Pt advised lab resutls

## 2014-05-11 NOTE — Telephone Encounter (Signed)
Patient missed a call, but there was no name left on VM. Please contact patient at [phone]

## 2014-05-30 ENCOUNTER — Telehealth: Payer: Self-pay | Admitting: Family Medicine

## 2014-05-30 NOTE — Telephone Encounter (Signed)
Patient dropped off health screen form for Dr. Neva Seat to fill out from last OV. Called and left message to return call. Form ready for p/u but she needs to complete form and we need to make copy for chart.

## 2014-05-30 NOTE — Telephone Encounter (Signed)
Patient returned call and will come by and complete

## 2014-05-31 ENCOUNTER — Telehealth: Payer: Self-pay

## 2014-05-31 NOTE — Telephone Encounter (Signed)
Completed form faxed with confirmation.

## 2014-05-31 NOTE — Telephone Encounter (Signed)
Pt states she came by yesterday and picked up a form Dr Neva Seat had filled out, however on #C he needed to put her name on it, would like it to be faxed to [phone] and please call her at [phone] when it is done Someone here had made a copy of the form

## 2014-09-27 ENCOUNTER — Other Ambulatory Visit: Payer: Self-pay

## 2014-09-27 DIAGNOSIS — Z1231 Encounter for screening mammogram for malignant neoplasm of breast: Secondary | ICD-10-CM

## 2014-10-21 ENCOUNTER — Encounter (INDEPENDENT_AMBULATORY_CARE_PROVIDER_SITE_OTHER): Payer: Self-pay

## 2014-10-21 ENCOUNTER — Ambulatory Visit
Admission: RE | Admit: 2014-10-21 | Discharge: 2014-10-21 | Disposition: A | Discharge status: Home / Self Care | Payer: 59 | Source: Ambulatory Visit

## 2014-10-21 DIAGNOSIS — Z1231 Encounter for screening mammogram for malignant neoplasm of breast: Secondary | ICD-10-CM

## 2014-10-24 ENCOUNTER — Ambulatory Visit: Payer: Self-pay

## 2014-10-29 ENCOUNTER — Other Ambulatory Visit: Payer: Self-pay | Admitting: Family Medicine

## 2014-12-16 ENCOUNTER — Ambulatory Visit (INDEPENDENT_AMBULATORY_CARE_PROVIDER_SITE_OTHER): Payer: 59 | Admitting: Physician Assistant

## 2014-12-16 VITALS — BP 142/98 | HR 72 | Temp 97.6°F | Resp 16 | Ht 67.0 in | Wt 214.4 lb

## 2014-12-16 DIAGNOSIS — F4321 Adjustment disorder with depressed mood: Secondary | ICD-10-CM

## 2014-12-16 DIAGNOSIS — I10 Essential (primary) hypertension: Secondary | ICD-10-CM

## 2014-12-16 MED ORDER — ESCITALOPRAM OXALATE 10 MG PO TABS: 10.0000 mg | ORAL_TABLET | Freq: Every day | ORAL | Status: DC

## 2014-12-16 MED ORDER — LISINOPRIL-HYDROCHLOROTHIAZIDE 10-12.5 MG PO TABS: 1.0000 | ORAL_TABLET | Freq: Every day | ORAL | Status: DC

## 2014-12-16 NOTE — Progress Notes (Signed)
12/16/2014 at 7:41 PM  Dana Reese / DOB: 24-Jan-1966 / MRN: [id number]  The patient has Routine general medical examination at a health care facility; Routine gynecological examination; Problems related to high-risk sexual behavior; Essential hypertension, benign; Family history of colon cancer; and Family history of breast cancer in first degree relative on her problem list.  SUBJECTIVE  Chief complaint: Medication Refill   History of present illness: Dana Reese is 49 y.o. well appearing female presenting for a medication refill of her BP medicine.  She was seen a little over a month ago by [employer] PA-C and was asked to follow up.  She took the medication but did not check her blood pressure.  She reports some emotional difficulty with her mother's passing last October.  She endorses dysthymic mood, low energy, poor sleep, guilt.  She denies SI/HI, decreased concentration, and psychomotor retardation.  She would like to try a medication today.   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    She has a current medication list which includes the following prescription(s): lisinopril-hydrochlorothiazide and escitalopram.  Dana Reese has No Known Allergies. She  reports that she has never smoked. She has never used smokeless tobacco. She reports that she does not drink alcohol or use illicit drugs. She  reports that she currently engages in sexual activity and has had female partners. She reports using the following method of birth control/protection: Surgical. The patient  has past surgical history that includes Tubal ligation and Colonoscopy (06/23/2012).  Her family history includes Cancer in her maternal grandmother and mother; Cancer (age of onset: 56) in her father; Colon cancer in her mother; Heart disease in her maternal grandfather; Hyperlipidemia in her mother; Hypertension in her  mother.  Review of Systems  Constitutional: Negative for fever, chills and weight loss.  Eyes: Negative.   Respiratory: Negative for cough and shortness of breath.   Cardiovascular: Negative for chest pain and leg swelling.  Gastrointestinal: Negative for heartburn, nausea, vomiting, abdominal pain, diarrhea and constipation.  Genitourinary: Negative.   Skin: Negative.   Neurological: Negative for dizziness and headaches.    OBJECTIVE  Her  height is  (1.702 m) and weight is 214 lb 6.4 oz (97.251 kg). Her oral temperature is 97.6 F (36.4 C). Her blood pressure is 142/98 and her pulse is 72. Her respiration is 16 and oxygen saturation is 98%.  The patient's body mass index is 33.57 kg/(m^2).  Physical Exam  Constitutional: She is oriented to person, place, and time. She appears well-developed and well-nourished. No distress.  Cardiovascular: Normal rate, regular rhythm, normal heart sounds and intact distal pulses.  Exam reveals no gallop and no friction rub.   No murmur heard. Respiratory: Effort normal and breath sounds normal. No respiratory distress. She has no wheezes. She has no rales. She exhibits no tenderness.  GI: Soft. Bowel sounds are normal. There is no tenderness.  Neurological: She is alert and oriented to person, place, and time. She has normal reflexes.  Skin: Skin is warm and dry. No rash noted. She is not diaphoretic. No erythema. No pallor.  Psychiatric:  Mental Status Exam:  Appearance: Well Groomed Eye Contact::  Good to fair Psychomotor:  Decreased Speech:  Normal Rate and without prosody Volume:  Decreased Mood:  Depressed Affect:  Congruent Thought Process:  Linear and Logical Orientation:  Full (Time, Place, and Person) Thought Content:  Negative Suicidal  Thoughts:  No Homicidal Thoughts:  No Judgement:  Intact Insight:  Good     No results found for this or any previous visit (from the past 24 hour(s)).  ASSESSMENT & PLAN  Dana Reese was  seen today for medication refill.  Diagnoses and all orders for this visit:  Adjustment disorder with depressed mood:  Orders: -     escitalopram (LEXAPRO) 10 MG tablet; Take 1 tablet (10 mg total) by mouth daily.  Essential hypertension, benign Orders: -     lisinopril-hydrochlorothiazide (PRINZIDE,ZESTORETIC) 10-12.5 MG per tablet;  -     Patient to follow up for this in 2 months along with the above.  She is to document her BP once weekly and bring this to her next OV.     The patient was advised to call or come back to clinic if she does not see an improvement in symptoms, or worsens with the above plan.   Deliah BostonMichael Clark, MHS, PA-C [facility] 12/16/2014 7:41 PM

## 2015-02-22 ENCOUNTER — Other Ambulatory Visit: Payer: Self-pay | Admitting: Physician Assistant

## 2015-03-31 ENCOUNTER — Ambulatory Visit (INDEPENDENT_AMBULATORY_CARE_PROVIDER_SITE_OTHER): Payer: 59 | Admitting: Urgent Care

## 2015-03-31 VITALS — BP 122/84 | HR 80 | Temp 98.7°F | Resp 16 | Ht 67.0 in | Wt 214.0 lb

## 2015-03-31 DIAGNOSIS — I10 Essential (primary) hypertension: Secondary | ICD-10-CM | POA: Diagnosis not present

## 2015-03-31 MED ORDER — LISINOPRIL-HYDROCHLOROTHIAZIDE 10-12.5 MG PO TABS: 1.0000 | ORAL_TABLET | Freq: Every day | ORAL | Status: DC

## 2015-03-31 NOTE — Progress Notes (Signed)
    MRN: [id number] DOB: 03/19/1966  Subjective:   Dana Reese is a 49 y.o. female presenting for chief complaint of Medication Refill  Presenting for follow up on hypertension. Currently she is managed with lisinopril hydrochlorothiazide combo. Patient reports compliance with her medication and does very well with this. Denies chest pain, heart racing, shortness of breath, palpitations, dizziness, chronic headaches, nausea, vomiting, abdominal pain, hematuria, lower leg swelling. Patient limits her salt intake, tries to eat very healthy and exercises occasionally. She denies smoking. Denies any other aggravating or relieving factors, no other questions or concerns.  Dana Reese has a current medication list which includes the following prescription(s): lisinopril-hydrochlorothiazide and escitalopram. She has No Known Allergies.  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 Also  has past surgical history that includes Tubal ligation and Colonoscopy (06/23/2012).  ROS As in subjective.  Objective:   Vitals: BP 122/84 mmHg  Pulse 80  Temp(Src) 98.7 F (37.1 C) (Oral)  Resp 16  Ht 5\' 7"  (1.702 m)  Wt 214 lb (97.07 kg)  BMI 33.51 kg/m2  SpO2 97%  BP Readings from Last 3 Encounters:  03/31/15 122/84  12/16/14 142/98  04/26/14 126/80   Physical Exam  Constitutional: She is oriented to person, place, and time. She appears well-developed and well-nourished.  HENT:  Mouth/Throat: Oropharynx is clear and moist.  Eyes: Conjunctivae are normal. Pupils are equal, round, and reactive to light. No scleral icterus.  Neck: Normal range of motion. No thyromegaly present.  Cardiovascular: Normal rate, regular rhythm and intact distal pulses.  Exam reveals no gallop and no friction rub.   No murmur heard. Pulmonary/Chest: No respiratory distress. She has no wheezes. She has no rales.  Abdominal: She exhibits  no distension and no mass. There is no tenderness.  Musculoskeletal: She exhibits no edema.  Neurological: She is alert and oriented to person, place, and time.  Skin: Skin is warm and dry. No rash noted. No erythema. No pallor.   Assessment and Plan :   1. Essential hypertension - Well controlled with current regimen. Refill provided for one year, recommended patient follow up in 6 months. Continue healthy diet and exercise, labs pending.  Wallis BambergMario Tejon Gracie, PA-C [facility] [phone] 03/31/2015 6:20 PM

## 2015-03-31 NOTE — Patient Instructions (Signed)
Managing Your High Blood Pressure Blood pressure is a measurement of how forceful your blood is pressing against the walls of the arteries. Arteries are muscular tubes within the circulatory system. Blood pressure does not stay the same. Blood pressure rises when you are active, excited, or nervous; and it lowers during sleep and relaxation. If the numbers measuring your blood pressure stay above normal most of the time, you are at risk for health problems. High blood pressure (hypertension) is a long-term (chronic) condition in which blood pressure is elevated. A blood pressure reading is recorded as two numbers, such as 120 over 80 (or 120/80). The first, higher number is called the systolic pressure. It is a measure of the pressure in your arteries as the heart beats. The second, lower number is called the diastolic pressure. It is a measure of the pressure in your arteries as the heart relaxes between beats.  Keeping your blood pressure in a normal range is important to your overall health and prevention of health problems, such as heart disease and stroke. When your blood pressure is uncontrolled, your heart has to work harder than normal. High blood pressure is a very common condition in adults because blood pressure tends to rise with age. Men and women are equally likely to have hypertension but at different times in life. Before age 45, men are more likely to have hypertension. After 50 years of age, women are more likely to have it. Hypertension is especially common in African Americans. This condition often has no signs or symptoms. The cause of the condition is usually not known. Your caregiver can help you come up with a plan to keep your blood pressure in a normal, healthy range. BLOOD PRESSURE STAGES Blood pressure is classified into four stages: normal, prehypertension, stage 1, and stage 2. Your blood pressure reading will be used to determine what type of treatment, if any, is necessary.  Appropriate treatment options are tied to these four stages:  Normal  Systolic pressure (mm Hg): below 120.  Diastolic pressure (mm Hg): below 80. Prehypertension  Systolic pressure (mm Hg): 120 to 139.  Diastolic pressure (mm Hg): 80 to 89. Stage1  Systolic pressure (mm Hg): 140 to 159.  Diastolic pressure (mm Hg): 90 to 99. Stage2  Systolic pressure (mm Hg): 160 or above.  Diastolic pressure (mm Hg): 100 or above. RISKS RELATED TO HIGH BLOOD PRESSURE Managing your blood pressure is an important responsibility. Uncontrolled high blood pressure can lead to:  A heart attack.  A stroke.  A weakened blood vessel (aneurysm).  Heart failure.  Kidney damage.  Eye damage.  Metabolic syndrome.  Memory and concentration problems. HOW TO MANAGE YOUR BLOOD PRESSURE Blood pressure can be managed effectively with lifestyle changes and medicines (if needed). Your caregiver will help you come up with a plan to bring your blood pressure within a normal range. Your plan should include the following: Education  Read all information provided by your caregivers about how to control blood pressure.  Educate yourself on the latest guidelines and treatment recommendations. New research is always being done to further define the risks and treatments for high blood pressure. Lifestylechanges  Control your weight.  Avoid smoking.  Stay physically active.  Reduce the amount of salt in your diet.  Reduce stress.  Control any chronic conditions, such as high cholesterol or diabetes.  Reduce your alcohol intake. Medicines  Several medicines (antihypertensive medicines) are available, if needed, to bring blood pressure within a normal range.   Communication  Review all the medicines you take with your caregiver because there may be side effects or interactions.  Talk with your caregiver about your diet, exercise habits, and other lifestyle factors that may be contributing to  high blood pressure.  See your caregiver regularly. Your caregiver can help you create and adjust your plan for managing high blood pressure. RECOMMENDATIONS FOR TREATMENT AND FOLLOW-UP  The following recommendations are based on current guidelines for managing high blood pressure in nonpregnant adults. Use these recommendations to identify the proper follow-up period or treatment option based on your blood pressure reading. You can discuss these options with your caregiver.  Systolic pressure of 120 to 139 or diastolic pressure of 80 to 89: Follow up with your caregiver as directed.  Systolic pressure of 140 to 160 or diastolic pressure of 90 to 100: Follow up with your caregiver within 2 months.  Systolic pressure above 160 or diastolic pressure above 100: Follow up with your caregiver within 1 month.  Systolic pressure above 180 or diastolic pressure above 110: Consider antihypertensive therapy; follow up with your caregiver within 1 week.  Systolic pressure above 200 or diastolic pressure above 120: Begin antihypertensive therapy; follow up with your caregiver within 1 week. Document Released: 06/03/2012 Document Reviewed: 06/03/2012 ExitCare Patient Information 2015 [employer]. This information is not intended to replace advice given to you by your health care provider. Make sure you discuss any questions you have with your health care provider.  

## 2015-04-05 ENCOUNTER — Telehealth: Payer: Self-pay | Admitting: Family Medicine

## 2015-04-05 NOTE — Telephone Encounter (Signed)
Spoke with [facility] they states that the system has been down and when its back working they will fax over labs plus give us a call with results 

## 2015-04-05 NOTE — Progress Notes (Signed)
Spoke with [facility] they states that the system has been down and when its back working they will fax over labs plus give us a call with results

## 2015-04-06 LAB — COMPREHENSIVE METABOLIC PANEL
ALBUMIN: 4.1 g/dL (ref 3.5–5.5)
ALT: 8 IU/L (ref 0–32)
AST: 11 IU/L (ref 0–40)
Albumin/Globulin Ratio: 1.3 (ref 1.1–2.5)
Alkaline Phosphatase: 59 IU/L (ref 39–117)
BILIRUBIN TOTAL: 0.3 mg/dL (ref 0.0–1.2)
BUN / CREAT RATIO: 13 (ref 9–23)
BUN: 12 mg/dL (ref 6–24)
CHLORIDE: 101 mmol/L (ref 97–108)
CO2: 23 mmol/L (ref 18–29)
Calcium: 9.5 mg/dL (ref 8.7–10.2)
Creatinine, Ser: 0.91 mg/dL (ref 0.57–1.00)
GFR calc Af Amer: 86 mL/min/{1.73_m2} (ref >59)
GFR calc non Af Amer: 74 mL/min/{1.73_m2} (ref >59)
Globulin, Total: 3.2 g/dL (ref 1.5–4.5)
Glucose: 94 mg/dL (ref 65–99)
POTASSIUM: 4.3 mmol/L (ref 3.5–5.2)
Sodium: 141 mmol/L (ref 134–144)
Total Protein: 7.3 g/dL (ref 6.0–8.5)

## 2015-04-06 NOTE — Progress Notes (Signed)
Orders in 

## 2015-04-07 ENCOUNTER — Encounter: Payer: Self-pay | Admitting: Family Medicine

## 2015-07-14 ENCOUNTER — Encounter: Payer: Self-pay | Admitting: Family Medicine

## 2015-07-14 ENCOUNTER — Ambulatory Visit (INDEPENDENT_AMBULATORY_CARE_PROVIDER_SITE_OTHER): Payer: 59 | Admitting: Family Medicine

## 2015-07-14 VITALS — BP 122/90 | HR 81 | Temp 97.8°F | Ht 65.75 in | Wt 218.0 lb

## 2015-07-14 DIAGNOSIS — I10 Essential (primary) hypertension: Secondary | ICD-10-CM

## 2015-07-14 DIAGNOSIS — Z130 Encounter for screening for diseases of the blood and blood-forming organs and certain disorders involving the immune mechanism: Secondary | ICD-10-CM

## 2015-07-14 DIAGNOSIS — B977 Papillomavirus as the cause of diseases classified elsewhere: Secondary | ICD-10-CM | POA: Diagnosis not present

## 2015-07-14 DIAGNOSIS — R888 Abnormal findings in other body fluids and substances: Secondary | ICD-10-CM

## 2015-07-14 DIAGNOSIS — N62 Hypertrophy of breast: Secondary | ICD-10-CM | POA: Diagnosis not present

## 2015-07-14 DIAGNOSIS — Z124 Encounter for screening for malignant neoplasm of cervix: Secondary | ICD-10-CM

## 2015-07-14 DIAGNOSIS — Z131 Encounter for screening for diabetes mellitus: Secondary | ICD-10-CM

## 2015-07-14 DIAGNOSIS — IMO0002 Reserved for concepts with insufficient information to code with codable children: Secondary | ICD-10-CM

## 2015-07-14 DIAGNOSIS — Z13220 Encounter for screening for lipoid disorders: Secondary | ICD-10-CM

## 2015-07-14 DIAGNOSIS — Z0000 Encounter for general adult medical examination without abnormal findings: Secondary | ICD-10-CM

## 2015-07-14 NOTE — Progress Notes (Addendum)
[facility] [address], [place] [address] [phone]  Date:  07/14/2015   Name:  Dana PedroLaura D Santaella   DOB:  1966/08/21   MRN:  [id number]  PCP:  Tonye PearsonOLITTLE, ROBERT P, MD    Chief Complaint: Annual Exam and Hypertension   History of Present Illness:  Dana Reese is a [age] y.o. very pleasant female patient who presents with the following:  Here today seeking a CPE with pap.   History of abnl pap in 2012.  She does have a history of HTN- on prinzide.  She is s/p BTL CMP done in July, cholesterol a year ago  She is a [employer] employee.  Works in [employer] She has 2 children- one grown and one a teenager She did loser her mother about a year ago- she does not feel that she is depressed but she still does miss her mom a lot  She is interested in a possible breast reduction surgery. She has noted back pain and shoulder pain, and dents in her shoulders from her large breasts.  She has had this concern for a very long time.  Her large breasts also limit her ability to exercise  She is NOT fasting today Peri-menopausal Non- smoker  BP Readings from Last 3 Encounters:  07/14/15 122/90  03/31/15 122/84  12/16/14 142/98     Patient Active Problem List   Diagnosis Date Noted  . Routine general medical examination at a health care facility 10/24/2012  . Routine gynecological examination 10/24/2012  . Problems related to high-risk sexual behavior 10/24/2012  . Essential hypertension, benign 10/24/2012  . Family history of colon cancer 10/24/2012  . Family history of breast cancer in first degree relative 10/24/2012    Past Medical History  Diagnosis Date  . Hypertension 09/23/2005  . Chlamydia 09/23/2010  . ASCUS with positive high risk HPV 05/25/2011    Past Surgical History  Procedure Laterality Date  . Tubal ligation    . Colonoscopy  06/23/2012    normal; repeat in 10 years; [facility].    Social History  Substance Use Topics  . Smoking  status: Never Smoker   . Smokeless tobacco: Never Used  . Alcohol Use: No    Family History  Problem Relation Age of Onset  . Colon cancer Mother   . Cancer Mother     Breast cancer age 50; colon cancer age 50; bladder cancer age 50.  Marland Kitchen. Hypertension Mother   . Hyperlipidemia Mother   . Cancer Maternal Grandmother   . Heart disease Maternal Grandfather   . Cancer Father [phone]    colon cancer.    No Known Allergies  Medication list has been reviewed and updated.  Current Outpatient Prescriptions on File Prior to Visit  Medication Sig Dispense Refill  . escitalopram (LEXAPRO) 10 MG tablet Take 1 tablet (10 mg total) by mouth daily. (Patient not taking: Reported on 03/31/2015) 30 tablet 2  . lisinopril-hydrochlorothiazide (PRINZIDE,ZESTORETIC) 10-12.5 MG per tablet Take 1 tablet by mouth daily. 90 tablet 3   No current facility-administered medications on file prior to visit.    Review of Systems:  As per HPI- otherwise negative.   Physical Examination: Filed Vitals:   07/14/15 1801  BP: 122/90  Pulse: 81  Temp: 97.8 F (36.6 C)   Filed Vitals:   07/14/15 1801  Height: 5' 5.75" (1.67 m)  Weight: 218 lb (98.884 kg)   Body mass index is 35.46 kg/(m^2). Ideal Body  Weight: Weight in (lb) to have BMI = 25: 153.4  GEN: WDWN, NAD, Non-toxic, A & O x 3, obese, looks well HEENT: Atraumatic, Normocephalic. Neck supple. No masses, No LAD.  Bilateral TM wnl, oropharynx normal.  PEERL,EOMI.   Ears and Nose: No external deformity. CV: RRR, No M/G/R. No JVD. No thrill. No extra heart sounds. PULM: CTA B, no wheezes, crackles, rhonchi. No retractions. No resp. distress. No accessory muscle use. ABD: S, NT, ND. No rebound. No HSM. EXTR: No c/c/e NEURO Normal gait.  PSYCH: Normally interactive. Conversant. Not depressed or anxious appearing.  Calm demeanor.  Breast: normal exam, no masses/ dimpling/ discharge Pelvic: normal, no vaginal lesions or discharge. Uterus normal, no CMT, no  adnexal tendereness or masses   Assessment and Plan: Physical exam  Screening for cervical cancer - Plan: Pap IG, CT/NG NAA, and HPV (high risk)  Large breasts - Plan: Ambulatory referral to Plastic Surgery  Essential hypertension  Screening for hyperlipidemia - Plan: LDL cholesterol, direct  Screening for diabetes mellitus - Plan: Hemoglobin A1c  Screening for deficiency anemia - Plan: CBC  Labs pending as above.  Also pap Referral to plastic surgery to discuss breast reduction She will get her flu shot at work next week Tetanus is UTD She did have a colonoscopy about 5 years ago as her mother did have colon cancer- however she thinks she is due for an update soon. She will call [facility] Advised to watch for any worsening depression as the fall and winter approaches- she will let me know if any concerns   Signed Abbe Amsterdam, MD  Received her labs and will send her a letter

## 2015-07-14 NOTE — Patient Instructions (Signed)
It is time for your repeat colonoscopy- please give [facility] a call   Address: [address], [place], [place] [address]  Phone: [phone]  I will be in touch with your labs asap Please let me know if you do not hear about your plastic surgery appt to discuss breast reduction

## 2015-07-18 LAB — HEMOGLOBIN A1C
Est. average glucose Bld gHb Est-mCnc: 105 mg/dL
Hgb A1c MFr Bld: 5.3 % (ref 4.8–5.6)

## 2015-07-18 LAB — CBC
Hematocrit: 36.6 % (ref 34.0–46.6)
Hemoglobin: 11.9 g/dL (ref 11.1–15.9)
MCH: 30.6 pg (ref 26.6–33.0)
MCHC: 32.5 g/dL (ref 31.5–35.7)
MCV: 94 fL (ref 79–97)
Platelets: 263 10*3/uL (ref 150–379)
RBC: 3.89 x10E6/uL (ref 3.77–5.28)
RDW: 14.5 % (ref 12.3–15.4)
WBC: 5.6 10*3/uL (ref 3.4–10.8)

## 2015-07-18 LAB — LDL CHOLESTEROL, DIRECT: LDL DIRECT: 161 mg/dL — AB (ref 0–99)

## 2015-07-19 LAB — PAP IG, CT-NG NAA, HPV HIGH-RISK
Chlamydia, Nuc. Acid Amp: NEGATIVE
Gonococcus by Nucleic Acid Amp: NEGATIVE
HPV, high-risk: POSITIVE — AB
PAP Smear Comment: 0

## 2015-07-26 ENCOUNTER — Encounter: Payer: Self-pay | Admitting: Family Medicine

## 2015-07-26 NOTE — Addendum Note (Signed)
Addended by: Abbe AmsterdamOPLAND, JESSICA C on: 07/26/2015 12:47 PM   Modules accepted: Orders

## 2015-08-21 ENCOUNTER — Encounter: Payer: Self-pay | Admitting: Internal Medicine

## 2015-10-04 ENCOUNTER — Encounter: Payer: Self-pay | Admitting: Gynecology

## 2015-10-04 ENCOUNTER — Ambulatory Visit (INDEPENDENT_AMBULATORY_CARE_PROVIDER_SITE_OTHER): Payer: 59 | Admitting: Gynecology

## 2015-10-04 VITALS — BP 128/86 | Ht 66.0 in | Wt 218.0 lb

## 2015-10-04 DIAGNOSIS — N951 Menopausal and female climacteric states: Secondary | ICD-10-CM | POA: Diagnosis not present

## 2015-10-04 DIAGNOSIS — N915 Oligomenorrhea, unspecified: Secondary | ICD-10-CM

## 2015-10-04 DIAGNOSIS — R232 Flushing: Secondary | ICD-10-CM

## 2015-10-04 DIAGNOSIS — B977 Papillomavirus as the cause of diseases classified elsewhere: Secondary | ICD-10-CM

## 2015-10-04 DIAGNOSIS — R8789 Other abnormal findings in specimens from female genital organs: Secondary | ICD-10-CM | POA: Diagnosis not present

## 2015-10-04 DIAGNOSIS — R87618 Other abnormal cytological findings on specimens from cervix uteri: Secondary | ICD-10-CM | POA: Insufficient documentation

## 2015-10-04 DIAGNOSIS — N840 Polyp of corpus uteri: Secondary | ICD-10-CM | POA: Insufficient documentation

## 2015-10-04 NOTE — Progress Notes (Signed)
   Patient is a 50 year old who was referred to our practice as a courtesy of her PCP  Dr. Deborha PaymentJessica Coplan as a result of patient's recent abnormal Pap smear. Patient had a normal Pap smear in 2014 and then on 2016 (October) her Pap smear was negative for dysplasia but positive high risk HPV. Her Pap smear was negative for GC and Chlamydia. Several years ago patient had been treated for Chlamydia infection. Review of her record also indicated in 2012 patient had had ascus on Pap smear with HPV having been detected. Patient also was complaining of hot flashes and irritability and mood swings. Her last menstrual cycle was 6 months ago she started bleeding in 2 days ago. She is currently on no hormone replacement therapy and is using condoms for contraception. She denies any recent change in sexual partners. Patient did state that over 20 years ago she had some form of abnormal Pap smear but does not recall any treatment but did have some form of biopsy. We have no documentation.  Patient underwent a detail colposcopic evaluation today to include the external genitalia, perineum and perirectal region. No lesions were noted with exception of a small sebaceous cysts on the left labia majora. After the speculum was introduced into the vagina a systematic inspection did not demonstrate any lesions the vagina, vaginal fornix her cervix. There was blood in the vaginal vault had to be cleared first and then applied the acetic acid for thorough inspection. The transformation 7 was not visualized. A vigorous ECC was obtained and was submitted for histological evaluation. Otherwise no lesions were seen.  Assessment/plan: #1 patient with recent Pap smear negative for dysplasia but positive for HPV negative GC and Chlamydia culture. Patient with past history of some form of dysplasia 20 years ago and ASCUS  in 2012. Complete colposcopic evaluation did not demonstrate any abnormality. An ECC was obtained. If the ECC is normal  AB recommended patient to follow-up with her PCP in 1 year for a Pap smear with HPV screen for close surveillance. #2 patient 50 years of age perimenopausal with hot flashes and FSH will be drawn today. I provided with literature information on the perimenopause as well as on hormone replacement therapy. She will return back in February for follow-up discussion [facility] Patient scheduled for breast reduction later this month.

## 2015-10-04 NOTE — Addendum Note (Signed)
Addended by: Berna SpareASTILLO, BLANCA A on: 10/04/2015 04:36 PM   Modules accepted: Orders

## 2015-10-04 NOTE — Patient Instructions (Signed)
Hormone Therapy At menopause, your body begins making less estrogen and progesterone hormones. This causes the body to stop having menstrual periods. This is because estrogen and progesterone hormones control your periods and menstrual cycle. A lack of estrogen may cause symptoms such as:  Hot flushes (or hot flashes).  Vaginal dryness.  Dry skin.  Loss of sex drive.  Risk of bone loss (osteoporosis). When this happens, you may choose to take hormone therapy to get back the estrogen lost during menopause. When the hormone estrogen is given alone, it is usually referred to as ET (Estrogen Therapy). When the hormone progestin is combined with estrogen, it is generally called HT (Hormone Therapy). This was formerly known as hormone replacement therapy (HRT). Your caregiver can help you make a decision on what will be best for you. The decision to use HT seems to change often as new studies are done. Many studies do not agree on the benefits of hormone replacement therapy. LIKELY BENEFITS OF HT INCLUDE PROTECTION FROM:  Hot Flushes (also called hot flashes) - A hot flush is a sudden feeling of heat that spreads over the face and body. The skin may redden like a blush. It is connected with sweats and sleep disturbance. Women going through menopause may have hot flushes a few times a month or several times per day depending on the woman.  Osteoporosis (bone loss) - Estrogen helps guard against bone loss. After menopause, a woman's bones slowly lose calcium and become weak and brittle. As a result, bones are more likely to break. The hip, wrist, and spine are affected most often. Hormone therapy can help slow bone loss after menopause. Weight bearing exercise and taking calcium with vitamin D also can help prevent bone loss. There are also medications that your caregiver can prescribe that can help prevent osteoporosis.  Vaginal dryness - Loss of estrogen causes changes in the vagina. Its lining may  become thin and dry. These changes can cause pain and bleeding during sexual intercourse. Dryness can also lead to infections. This can cause burning and itching. (Vaginal estrogen treatment can help relieve pain, itching, and dryness.)  Urinary tract infections are more common after menopause because of lack of estrogen. Some women also develop urinary incontinence because of low estrogen levels in the vagina and bladder.  Possible other benefits of estrogen include a positive effect on mood and short-term memory in women. RISKS AND COMPLICATIONS  Using estrogen alone without progesterone causes the lining of the uterus to grow. This increases the risk of lining of the uterus (endometrial) cancer. Your caregiver should give another hormone called progestin if you have a uterus.  Women who take combined (estrogen and progestin) HT appear to have an increased risk of breast cancer. The risk appears to be small, but increases throughout the time that HT is taken.  Combined therapy also makes the breast tissue slightly denser which makes it harder to read mammograms (breast X-rays).  Combined, estrogen and progesterone therapy can be taken together every day, in which case there may be spotting of blood. HT therapy can be taken cyclically in which case you will have menstrual periods. Cyclically means HT is taken for a set amount of days, then not taken, then this process is repeated.  HT may increase the risk of stroke, heart attack, breast cancer and forming blood clots in your leg.  Transdermal estrogen (estrogen that is absorbed through the skin with a patch or a cream) may have better results with:    Cholesterol.  Blood pressure.  Blood clots. Having the following conditions may indicate you should not have HT:  Endometrial cancer.  Liver disease.  Breast cancer.  Heart disease.  History of blood clots.  Stroke. TREATMENT   If you choose to take HT and have a uterus, usually  estrogen and progestin are prescribed.  Your caregiver will help you decide the best way to take the medications.  Possible ways to take estrogen include:  Pills.  Patches.  Gels.  Sprays.  Vaginal estrogen cream, rings and tablets.  It is best to take the lowest dose possible that will help your symptoms and take them for the shortest period of time that you can.  Hormone therapy can help relieve some of the problems (symptoms) that affect women at menopause. Before making a decision about HT, talk to your caregiver about what is best for you. Be well informed and comfortable with your decisions. HOME CARE INSTRUCTIONS   Follow your caregivers advice when taking the medications.  A Pap test is done to screen for cervical cancer.  The first Pap test should be done at age 21.  Between ages 21 and 29, Pap tests are repeated every 2 years.  Beginning at age 30, you are advised to have a Pap test every 3 years as long as the past 3 Pap tests have been normal.  Some women have medical problems that increase the chance of getting cervical cancer. Talk to your caregiver about these problems. It is especially important to talk to your caregiver if a new problem develops soon after your last Pap test. In these cases, your caregiver may recommend more frequent screening and Pap tests.  The above recommendations are the same for women who have or have not gotten the vaccine for HPV (human papillomavirus).  If you had a hysterectomy for a problem that was not a cancer or a condition that could lead to cancer, then you no longer need Pap tests. However, even if you no longer need a Pap test, a regular exam is a good idea to make sure no other problems are starting.  If you are between ages 65 and 70, and you have had normal Pap tests going back 10 years, you no longer need Pap tests. However, even if you no longer need a Pap test, a regular exam is a good idea to make sure no other problems  are starting.  If you have had past treatment for cervical cancer or a condition that could lead to cancer, you need Pap tests and screening for cancer for at least 20 years after your treatment.  If Pap tests have been discontinued, risk factors (such as a new sexual partner)need to be re-assessed to determine if screening should be resumed.  Some women may need screenings more often if they are at high risk for cervical cancer.  Get mammograms done as per the advice of your caregiver. SEEK IMMEDIATE MEDICAL CARE IF:  You develop abnormal vaginal bleeding.  You have pain or swelling in your legs, shortness of breath, or chest pain.  You develop dizziness or headaches.  You have lumps or changes in your breasts or armpits.  You have slurred speech.  You develop weakness or numbness of your arms or legs.  You have pain, burning, or bleeding when urinating.  You develop abdominal pain.   This information is not intended to replace advice given to you by your health care provider. Make sure you discuss any questions   you have with your health care provider.   Document Released: 06/08/2003 Document Revised: 01/24/2015 Document Reviewed: 03/13/2015 Elsevier Interactive Patient Education 2016 Else[employer] Perimenopause Perimenopause is the time when your body begins to move into the menopause (no menstrual period for 12 straight months). It is a natural process. Perimenopause can begin 2-8 years before the menopause and usually lasts for 1 year after the menopause. During this time, your ovaries may or may not produce an egg. The ovaries vary in their production of estrogen and progesterone hormones each month. This can cause irregular menstrual periods, difficulty getting pregnant, vaginal bleeding between periods, and uncomfortable symptoms. CAUSES  Irregular production of the ovarian hormones, estrogen and progesterone, and not ovulating every month.  Other causes  include:  Tumor of the pituitary gland in the brain.  Medical disease that affects the ovaries.  Radiation treatment.  Chemotherapy.  Unknown causes.  Heavy smoking and excessive alcohol intake can bring on perimenopause sooner. SIGNS AND SYMPTOMS   Hot flashes.  Night sweats.  Irregular menstrual periods.  Decreased sex drive.  Vaginal dryness.  Headaches.  Mood swings.  Depression.  Memory problems.  Irritability.  Tiredness.  Weight gain.  Trouble getting pregnant.  The beginning of losing bone cells (osteoporosis).  The beginning of hardening of the arteries (atherosclerosis). DIAGNOSIS  Your health care provider will make a diagnosis by analyzing your age, menstrual history, and symptoms. He or she will do a physical exam and note any changes in your body, especially your female organs. Female hormone tests may or may not be helpful depending on the amount of female hormones you produce and when you produce them. However, other hormone tests may be helpful to rule out other problems. TREATMENT  In some cases, no treatment is needed. The decision on whether treatment is necessary during the perimenopause should be made by you and your health care provider based on how the symptoms are affecting you and your lifestyle. Various treatments are available, such as:  Treating individual symptoms with a specific medicine for that symptom.  Herbal medicines that can help specific symptoms.  Counseling.  Group therapy. HOME CARE INSTRUCTIONS   Keep track of your menstrual periods (when they occur, how heavy they are, how long between periods, and how long they last) as well as your symptoms and when they started.  Only take over-the-counter or prescription medicines as directed by your health care provider.  Sleep and rest.  Exercise.  Eat a diet that contains calcium (good for your bones) and soy (acts like the estrogen hormone).  Do not smoke.  Avoid  alcoholic beverages.  Take vitamin supplements as recommended by your health care provider. Taking vitamin E may help in certain cases.  Take calcium and vitamin D supplements to help prevent bone loss.  Group therapy is sometimes helpful.  Acupuncture may help in some cases. SEEK MEDICAL CARE IF:   You have questions about any symptoms you are having.  You need a referral to a specialist (gynecologist, psychiatrist, or psychologist). SEEK IMMEDIATE MEDICAL CARE IF:   You have vaginal bleeding.  Your period lasts longer than 8 days.  Your periods are recurring sooner than 21 days.  You have bleeding after intercourse.  You have severe depression.  You have pain when you urinate.  You have severe headaches.  You have vision problems.   This information is not intended to replace advice given to you by your health care provider. Make sure you discuss any   questions you have with your health care provider.   Document Released: 10/17/2004 Document Revised: 09/30/2014 Document Reviewed: 04/08/2013 Elsevier Interactive Patient Education 2016 Elsevier Inc.  

## 2015-10-04 NOTE — Addendum Note (Signed)
Addended by: Berna SpareASTILLO, Kyvon Hu A on: 10/04/2015 04:35 PM   Modules accepted: Orders

## 2015-10-10 ENCOUNTER — Telehealth: Payer: Self-pay

## 2015-10-10 NOTE — Telephone Encounter (Signed)
Per staff message from Dr. Glenetta Hew:  ----- Message -----   From: Ok Edwards, MD   Sent: 10/10/2015 12:57 PM    To: Aura Camps, RMA   Please inform patient that I reviewed her [facility] from [facility] and it was in the menopausal range. I've given her literature information on the menopause and hormone replacement therapy and she was going to make a follow-up appointment sometime in February or sooner if her symptoms worsened and needs to be treated.

## 2015-10-11 NOTE — Telephone Encounter (Signed)
Left detailed message on voice mail per DPR access note. 

## 2015-10-13 ENCOUNTER — Other Ambulatory Visit: Payer: Self-pay

## 2015-10-13 DIAGNOSIS — Z1231 Encounter for screening mammogram for malignant neoplasm of breast: Secondary | ICD-10-CM

## 2015-10-19 ENCOUNTER — Other Ambulatory Visit: Payer: Self-pay | Admitting: Plastic Surgery

## 2015-10-19 DIAGNOSIS — N62 Hypertrophy of breast: Secondary | ICD-10-CM

## 2015-10-19 NOTE — H&P (Signed)
Dana Reese is an 50 y.o. female.   Chief Complaint: symptomatic mammary hypertrophy HPI: The patient is a 50 yrs old bf here for treatment of her symptomatic mammary hypertrophy. She has extremely large breasts causing symptoms that include the following: Back pain (upper and lower) and neck pain. She frequently pins bra cups higher on straps for better lift and relief. Notices relief when holding breast up in her hands. Shoulder straps causing grooves and pain requiring padding. Pain medication is sometimes required with motrin and tylenol.  Her breasts are extremely large and fairly symmetric. She has hyperpigmentation of the inframammary area on both sides. The sternal to nipple distance on the right is 38 and the left is 40. She is 5 feet 5 inches tall and weighs 218 pounds. Preoperative bra size = 40H cup and would like to be a D cup. The estimated excess breast tissue to be removed at the time of surgery is 780 grams on the left and 750 grams on the right.  Mammogram from February 2016 was without evidence of malignancy.    Past Medical History  Diagnosis Date  . Hypertension 09/23/2005  . Chlamydia 09/23/2010  . ASCUS with positive high risk HPV 05/25/2011    Past Surgical History  Procedure Laterality Date  . Tubal ligation    . Colonoscopy  06/23/2012    normal; repeat in 10 years; [facility].    Family History  Problem Relation Age of Onset  . Colon cancer Mother   . Cancer Mother     Breast cancer age 60; colon cancer age 42; bladder cancer age 24.  Marland Kitchen Hypertension Mother   . Hyperlipidemia Mother   . Cancer Maternal Grandmother     COLON  . Heart disease Maternal Grandfather   . Cancer Father 66    colon cancer.   Social History:  reports that she has never smoked. She has never used smokeless tobacco. She reports that she does not drink alcohol or use illicit drugs.  Allergies: No Known Allergies  No prescriptions prior to admission    No results found for this or  any previous visit (from the past 48 hour(s)). No results found.  Review of Systems  Constitutional: Negative.   Eyes: Negative.   Respiratory: Negative.   Cardiovascular: Negative.   Gastrointestinal: Negative.   Genitourinary: Negative.   Musculoskeletal: Positive for back pain and neck pain.  Skin: Negative.   Neurological: Negative.   Psychiatric/Behavioral: Negative.     Last menstrual period 03/30/2014. Physical Exam  Constitutional: She is oriented to person, place, and time. She appears well-developed and well-nourished.  HENT:  Head: Normocephalic and atraumatic.  Eyes: Conjunctivae and EOM are normal. Pupils are equal, round, and reactive to light.  Cardiovascular: Normal rate.   Respiratory: Effort normal.  GI: Soft.  Neurological: She is alert and oriented to person, place, and time.  Skin: Skin is warm.  Psychiatric: She has a normal mood and affect. Her behavior is normal. Judgment and thought content normal.     Assessment/Plan Plan for bilateral breast reduction with liposuction.  Peggye Form 10/19/2015, 3:57 PM

## 2015-10-20 ENCOUNTER — Encounter (HOSPITAL_BASED_OUTPATIENT_CLINIC_OR_DEPARTMENT_OTHER): Payer: Self-pay | Admitting: *Deleted

## 2015-10-25 ENCOUNTER — Ambulatory Visit (HOSPITAL_BASED_OUTPATIENT_CLINIC_OR_DEPARTMENT_OTHER): Admission: RE | Admit: 2015-10-25 | Payer: 59 | Source: Ambulatory Visit | Admitting: Plastic Surgery

## 2015-10-25 HISTORY — DX: Hypertrophy of breast: N62

## 2015-10-25 SURGERY — MAMMOPLASTY, REDUCTION
Anesthesia: General | Laterality: Bilateral

## 2015-11-02 ENCOUNTER — Ambulatory Visit: Payer: 59 | Admitting: Gynecology

## 2015-11-23 ENCOUNTER — Ambulatory Visit
Admission: RE | Admit: 2015-11-23 | Discharge: 2015-11-23 | Disposition: A | Discharge status: Home / Self Care | Payer: 59 | Source: Ambulatory Visit

## 2015-11-23 DIAGNOSIS — Z1231 Encounter for screening mammogram for malignant neoplasm of breast: Secondary | ICD-10-CM

## 2015-11-27 ENCOUNTER — Ambulatory Visit: Payer: 59

## 2015-12-07 ENCOUNTER — Ambulatory Visit: Payer: 59 | Admitting: Gynecology

## 2016-01-17 ENCOUNTER — Ambulatory Visit: Payer: 59 | Admitting: Gynecology

## 2016-04-15 ENCOUNTER — Other Ambulatory Visit: Payer: Self-pay | Admitting: Urgent Care

## 2016-04-15 DIAGNOSIS — I10 Essential (primary) hypertension: Secondary | ICD-10-CM

## 2016-07-20 ENCOUNTER — Telehealth: Payer: Self-pay | Admitting: *Deleted

## 2016-07-20 ENCOUNTER — Ambulatory Visit (INDEPENDENT_AMBULATORY_CARE_PROVIDER_SITE_OTHER): Payer: 59 | Admitting: Urgent Care

## 2016-07-20 VITALS — BP 116/88 | HR 74 | Temp 97.9°F | Resp 17 | Ht 66.0 in | Wt 212.0 lb

## 2016-07-20 DIAGNOSIS — Z8619 Personal history of other infectious and parasitic diseases: Secondary | ICD-10-CM | POA: Diagnosis not present

## 2016-07-20 DIAGNOSIS — Z8742 Personal history of other diseases of the female genital tract: Secondary | ICD-10-CM

## 2016-07-20 DIAGNOSIS — Z8639 Personal history of other endocrine, nutritional and metabolic disease: Secondary | ICD-10-CM

## 2016-07-20 DIAGNOSIS — I10 Essential (primary) hypertension: Secondary | ICD-10-CM | POA: Diagnosis not present

## 2016-07-20 DIAGNOSIS — Z0000 Encounter for general adult medical examination without abnormal findings: Secondary | ICD-10-CM

## 2016-07-20 DIAGNOSIS — Z87898 Personal history of other specified conditions: Secondary | ICD-10-CM

## 2016-07-20 LAB — POCT WET + KOH PREP
TRICH BY WET PREP: ABSENT
Yeast by KOH: ABSENT
Yeast by wet prep: ABSENT

## 2016-07-20 MED ORDER — LISINOPRIL 10 MG PO TABS: 10.0000 mg | ORAL_TABLET | Freq: Every day | ORAL | 3 refills | Status: DC

## 2016-07-20 MED ORDER — METRONIDAZOLE 500 MG PO TABS: 500.0000 mg | ORAL_TABLET | Freq: Two times a day (BID) | ORAL | 0 refills | Status: DC

## 2016-07-20 NOTE — Telephone Encounter (Signed)
Spoke with patient advised to take Flagyl no ETOH while on.  Patient understood.

## 2016-07-20 NOTE — Patient Instructions (Signed)
     IF you received an x-ray today, you will receive an invoice from [facility]. Please contact [facility] at [phone] with questions or concerns regarding your invoice.   IF you received labwork today, you will receive an invoice from [facility]. Please contact [facility] at [phone] with questions or concerns regarding your invoice.   Our billing staff will not be able to assist you with questions regarding bills from these companies.  You will be contacted with the lab results as soon as they are available. The fastest way to get your results is to activate your My Chart account. Instructions are located on the last page of this paperwork. If you have not heard from us regarding the results in 2 weeks, please contact this office.      

## 2016-07-20 NOTE — Progress Notes (Signed)
Patient ID: Dana Reese, female   DOB: 1965-10-04, 50 y.o.   MRN: [id number]  By signing my name below I, Dana Reese, attest that this documentation has been prepared under the direction and in the presence of Dana Reese, [place]. Electonically Signed. Dana Reese, Scribe 07/20/2016 at 11:17 AM   MRN: [id number]  Subjective:   [name] is a 50 y.o. female presenting for annual physical exam and lisinopril prescription refil.  Medical care team includes: PCP: None established, previously Dr. Merla Richesoolittle. Vision: Wears glasses and sees her Eye doctor every year. Dental: Sees Dr. Amie CritchleyBlackwell annually. OB/GYN: 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 Mammogram completed February 2017 and was normal. Specialists: None.  Currently single. Lives with her daughter and brother. Home life is good. Has good support system. Works for [employer]. Sexually active with monogamous partner, does not use condoms. Agrees to STI testing today. Exercises regularly. Denies healthy eating habits. Denies smoking cigarettes or drinking alcohol.  Health Maintenance: Last colonoscopy in 2013 was normal.    Dana Reese has Routine general medical examination at a health care facility; Routine gynecological examination; Problems related to high-risk sexual behavior; Essential hypertension, benign; Family history of colon cancer; Family history of breast cancer in first degree relative; Macromastia; Abnormal Papanicolaou smear of cervix with positive human papilloma virus (HPV) test; and Perimenopause on her problem list.  Dana Reese has a current medication list which includes the following prescription(s): lisinopril. She has No Known Allergies.  Dana Reese  has a past medical history of ASCUS with positive high risk HPV (05/25/2011); Chlamydia (09/23/2010); Hypertension (09/23/2005); and Symptomatic mammary hypertrophy. Also  has a past surgical history that includes Tubal ligation and  Colonoscopy (06/23/2012).  Her family history includes Cancer in her maternal grandmother and mother; Cancer (age of onset: 4960) in her father; Colon cancer in her mother; Heart disease in her maternal grandfather; Hyperlipidemia in her mother; Hypertension in her mother.  Immunizations:  Gets flu immunizations at work. Last Tdap 11/21/2008.   Review of Systems  Constitutional: Negative for chills, diaphoresis, fever, malaise/fatigue and weight loss.  HENT: Negative for congestion, ear discharge, ear pain, hearing loss, nosebleeds, sore throat and tinnitus.   Eyes: Negative for blurred vision, double vision, photophobia, pain, discharge and redness.  Respiratory: Negative for cough, shortness of breath and wheezing.   Cardiovascular: Negative for chest pain, palpitations and leg swelling.  Gastrointestinal: Negative for abdominal pain, blood in stool, constipation, diarrhea, nausea and vomiting.  Genitourinary: Negative for dysuria, flank pain, frequency, hematuria and urgency.  Musculoskeletal: Negative for back pain, joint pain and myalgias.  Skin: Negative for itching and rash.  Neurological: Negative for dizziness, tingling, seizures, loss of consciousness, weakness and headaches.  Endo/Heme/Allergies: Negative for polydipsia.  Psychiatric/Behavioral: Negative for depression, hallucinations, memory loss, substance abuse and suicidal ideas. The patient is not nervous/anxious and does not have insomnia.    Objective:   Vitals: BP 116/88 (BP Location: Right Arm, Patient Position: Sitting, Cuff Size: Large)    Pulse 74    Temp 97.9 F (36.6 C) (Oral)    Resp 17    Ht 5\' 6"  (1.676 m)    Wt 212 lb (96.2 kg)    LMP 03/30/2014    SpO2 100%    BMI 34.22 kg/m   Physical Exam  Constitutional: She is oriented to person, place, and time. She appears well-developed and well-nourished.  HENT:  TM's intact bilaterally, no effusions or erythema.  Nasal turbinates pink and moist, nasal passages patent.  No sinus tenderness. Oropharynx clear, mucous membranes moist, dentition in good repair.  Eyes: Conjunctivae and EOM are normal. Pupils are equal, round, and reactive to light. Right eye exhibits no discharge. Left eye exhibits no discharge. No scleral icterus.  Neck: Normal range of motion. Neck supple. No thyromegaly present.  Cardiovascular: Normal rate, regular rhythm and intact distal pulses.  Exam reveals no gallop and no friction rub.   No murmur heard. Pulmonary/Chest: No respiratory distress. She has no wheezes. She has no rales.  Abdominal: Soft. Bowel sounds are normal. She exhibits no distension and no mass. There is no tenderness.  Genitourinary: There is no rash, tenderness or lesion on the right labia. There is no rash, tenderness or lesion on the left labia. Uterus is not deviated, not enlarged and not tender. Cervix exhibits discharge (copious thick white). Cervix exhibits no motion tenderness and no friability. Right adnexum displays no mass, no tenderness and no fullness. Left adnexum displays no mass, no tenderness and no fullness.  Musculoskeletal: Normal range of motion. She exhibits no edema or tenderness.  Lymphadenopathy:    She has no cervical adenopathy.  Neurological: She is alert and oriented to person, place, and time. She has normal reflexes.  Skin: Skin is warm and dry. No rash noted. No erythema. No pallor.  Psychiatric: She has a normal mood and affect.   Results for orders placed or performed in visit on 07/20/16 (from the past 24 hour(s))  POCT Wet + KOH Prep     Status: Abnormal   Collection Time: 07/20/16 11:46 AM  Result Value Ref Range   Yeast by KOH Absent Present, Absent   Yeast by wet prep Absent Present, Absent   WBC by wet prep Few None, Few, Too numerous to count   Clue Cells Wet Prep HPF POC Moderate (A) None, Too numerous to count   Trich by wet prep Absent Present, Absent   Bacteria Wet Prep HPF POC Many (A) None, Few, Too numerous to count    Epithelial Cells By [employer]et Pref ([facility]) Many (A) None, Few, Too numerous to count   RBC,UR,HPF,POC None None RBC/hpf   Assessment and Plan :   1. Annual physical exam - Medically stable, counseled on dietary modifications. Labs pending. Discussed healthy lifestyle, diet, exercise, preventative care, vaccinations, and addressed patient's concerns. For completed for her work.  2. Essential hypertension - Labs pending, BP well controlled. Refill provided for 1 year, f/u in 6 months.  3. History of vitamin D deficiency - Labs pending.  4. History of abnormal cervical Pap smear 5. History of HPV infection - Will cover for BV given POC results, physical exam findings.  6. Morbid obesity (HCC) - Referral to nutritional counseling.  Dana BambergMario Mani, PA-C [facility] [phone] 07/20/2016  10:43 AM

## 2016-07-23 LAB — COMPREHENSIVE METABOLIC PANEL
ALBUMIN: 4.8 g/dL (ref 3.5–5.5)
ALT: 7 IU/L (ref 0–32)
AST: 14 IU/L (ref 0–40)
Albumin/Globulin Ratio: 1.7 (ref 1.2–2.2)
Alkaline Phosphatase: 62 IU/L (ref 39–117)
BILIRUBIN TOTAL: 0.5 mg/dL (ref 0.0–1.2)
BUN / CREAT RATIO: 17 (ref 9–23)
BUN: 15 mg/dL (ref 6–24)
CHLORIDE: 102 mmol/L (ref 96–106)
CO2: 21 mmol/L (ref 18–29)
CREATININE: 0.86 mg/dL (ref 0.57–1.00)
Calcium: 9.9 mg/dL (ref 8.7–10.2)
GFR, EST AFRICAN AMERICAN: 90 mL/min/{1.73_m2} (ref >59)
GFR, EST NON AFRICAN AMERICAN: 78 mL/min/{1.73_m2} (ref >59)
GLUCOSE: 78 mg/dL (ref 65–99)
Globulin, Total: 2.8 g/dL (ref 1.5–4.5)
Potassium: 4.2 mmol/L (ref 3.5–5.2)
Sodium: 141 mmol/L (ref 134–144)
TOTAL PROTEIN: 7.6 g/dL (ref 6.0–8.5)

## 2016-07-23 LAB — VITAMIN D 25 HYDROXY (VIT D DEFICIENCY, FRACTURES): Vit D, 25-Hydroxy: 31 ng/mL (ref 30.0–100.0)

## 2016-07-24 LAB — CBC
HEMATOCRIT: 36.3 % (ref 34.0–46.6)
HEMOGLOBIN: 12.6 g/dL (ref 11.1–15.9)
MCH: 30.9 pg (ref 26.6–33.0)
MCHC: 34.7 g/dL (ref 31.5–35.7)
MCV: 89 fL (ref 79–97)
Platelets: 266 10*3/uL (ref 150–379)
RBC: 4.08 x10E6/uL (ref 3.77–5.28)
RDW: 13.3 % (ref 12.3–15.4)
WBC: 6.6 10*3/uL (ref 3.4–10.8)

## 2016-07-24 LAB — HIV ANTIBODY (ROUTINE TESTING W REFLEX): HIV Screen 4th Generation wRfx: NONREACTIVE

## 2016-07-24 LAB — LIPID PANEL
CHOL/HDL RATIO: 3.7 ratio (ref 0.0–4.4)
Cholesterol, Total: 252 mg/dL — ABNORMAL HIGH (ref 100–199)
HDL: 68 mg/dL (ref >39)
LDL CALC: 172 mg/dL — AB (ref 0–99)
TRIGLYCERIDES: 62 mg/dL (ref 0–149)
VLDL CHOLESTEROL CAL: 12 mg/dL (ref 5–40)

## 2016-07-24 LAB — RPR: RPR Ser Ql: NONREACTIVE

## 2016-07-26 LAB — PAP IG, CT-NG, RFX HPV ASCU
Chlamydia, Nuc. Acid Amp: NEGATIVE
Gonococcus by Nucleic Acid Amp: NEGATIVE
PAP Smear Comment: 0

## 2016-07-31 ENCOUNTER — Other Ambulatory Visit: Payer: Self-pay | Admitting: Urgent Care

## 2016-07-31 ENCOUNTER — Encounter: Payer: Self-pay | Admitting: Urgent Care

## 2016-07-31 DIAGNOSIS — R87612 Low grade squamous intraepithelial lesion on cytologic smear of cervix (LGSIL): Secondary | ICD-10-CM

## 2016-07-31 MED ORDER — ATORVASTATIN CALCIUM 20 MG PO TABS: 20.0000 mg | ORAL_TABLET | Freq: Every day | ORAL | 1 refills | Status: DC

## 2016-09-06 ENCOUNTER — Encounter: Payer: Self-pay | Admitting: Gynecology

## 2016-09-06 ENCOUNTER — Ambulatory Visit (INDEPENDENT_AMBULATORY_CARE_PROVIDER_SITE_OTHER): Payer: 59 | Admitting: Gynecology

## 2016-09-06 VITALS — BP 136/90 | Ht 66.25 in | Wt 213.0 lb

## 2016-09-06 DIAGNOSIS — R87612 Low grade squamous intraepithelial lesion on cytologic smear of cervix (LGSIL): Secondary | ICD-10-CM | POA: Diagnosis not present

## 2016-09-06 NOTE — Patient Instructions (Signed)
Conization of the Cervix, Care After These instructions give you information on caring for yourself after your procedure. Your doctor may also give you more specific instructions. Call your doctor if you have any problems or questions after your procedure. Follow these instructions at home:  Have someone drive you home after the procedure.  Only take medicines as told by your doctor. Do not take aspirin. It can cause bleeding.  Take showers for the first week. Do not take baths, swim, or use hot tubs until your doctor says it is okay.  Do not douche, use tampons, or have sex (sexual intercourse) until your doctor says it is okay.  For 7-14 days, avoid:  Being very active.  Exercising.  Heavy lifting.  Resume your usual diet unless your doctor tells you differently.  If you cannot poop (you are constipated), you may:  Take a mild laxative as told by your doctor.  Add fruit and bran to your diet.  Make sure to drink enough fluids to keep your pee (urine) clear or pale yellow.  Keep your follow-up appointments as told by your doctor. Contact a doctor if:  You have a rash.  You are dizzy or lightheaded.  You feel sick to your stomach (nauseous).  You develop a bad smelling vaginal discharge. Get help right away if:  You have blood clots or bleeding that is heavier than a normal menstrual period. For example, you soak a pad in less than 1 hour.  You have bright red bleeding.  You have a fever over 101F (38.3C) or lasting symptoms for more than 2-3 days.  You have a fever over 101F (38.3C) and your symptoms suddenly get worse.  You have increasing cramps.  You pass out (faint).  You have pain when peeing.  You have bloody pee.  You start throwing up (vomiting).  Your pain does not get better when you take your medicine.  Your have a lot of pain.  Your pain is getting worse. This information is not intended to replace advice given to you by your health  care provider. Make sure you discuss any questions you have with your health care provider. Document Released: 06/18/2008 Document Revised: 02/15/2016 Document Reviewed: 03/05/2013 Elsevier Interactive Patient Education  2017 Elsevier Inc. Conization of the Cervix Cervical conization is the cutting (excision) of a cone-shaped portion of the cervix. The procedure is performed through the vagina in either your health care provider's office or an operating room. This procedure is usually done when there is abnormal bleeding from the cervix. It can also be done to evaluate an abnormal Pap test or if an abnormality is seen on the cervix during an exam. The tissue is then examined to see if there are precancerous cells or cancer present.  Conization of the cervix is not done during a menstrual period or pregnancy.  LET [facility] KNOW ABOUT:  Any allergies you have.   All medicines you are taking, including vitamins, herbs, eye drops, creams, and over-the-counter medicines.   Previous problems you or members of your family have had with the use of anesthetics.   Any blood disorders you have.   Previous surgeries you have had.   Medical conditions you have.   Your smoking habits.   The possibility of being pregnant.  RISKS AND COMPLICATIONS  Generally, conization of the cervix is a safe procedure. However, as with any procedure, complications can occur. Possible complications include:  Heavy bleeding several days or weeks after the  procedure. Light bleeding or spotting after the procedure is normal.  Infection (rare).  Damage to the cervix or surrounding organs (uncommon).   Problems with the anesthesia.   Increased risk of preterm labor in future pregnancies. BEFORE THE PROCEDURE  Do not eat or drink anything for 6-8 hours before the procedure.   Do not take aspirin or blood thinners for at least a week before the procedure or as directed by your health  care provider.   Arrange for someone to take you home after the procedure.  PROCEDURE There are three different methods to perform conization of the cervix. These include:   The cold knife method. In this method a small cone-shaped sample of tissue is cut out with a knife (scalpel) from the cervical canal and the transformation zone (where the normal cells end and the abnormal cells begin).   The LEEP method. In this method a small cone-shaped sample of tissue is cut out with a thin wire that can burn (cauterize) the cervical tissue with an electrical current.   Laser treatment. In this method a small cone-shaped sample of tissue is cut out and then cauterized with a laser beam to prevent bleeding.  The procedure will be performed as follows:   Depending on the method, you will either be given a medicine to make you sleep (general anesthetic) or a numbing medicine (local anesthetic). A medicine that numbs the cervix (cervical block) may be given.   A lubricated device called a speculum will be inserted into the vagina to spread open the walls of the vagina. This will help your health care provider see the inside of the vagina and cervix better.   The tissue from the cervix will be removed and examined.   The results of the procedure will help your health care provider decide if further treatment is necessary. They will also help your health care provider decide on the best treatment if your results are abnormal. AFTER THE PROCEDURE  If you had a general anesthetic, you may be groggy for 2-3 hours after the procedure.   If you had a local anesthetic, you will rest at the clinic or hospital until you are stable and feel ready to go home.   Recovery may take up to 3 weeks.   You may have some cramping for about 1 week.   You may have bloody discharge or light bleeding for 1-2 weeks.   You may have black discharge coming from the vagina. This is from the paste used on the  cervix to prevent bleeding. This is normal discharge.  This information is not intended to replace advice given to you by your health care provider. Make sure you discuss any questions you have with your health care provider. Document Released: 06/19/2005 Document Revised: 09/14/2013 Document Reviewed: 03/05/2013 Elsevier Interactive Patient Education  2017 [employer]

## 2016-09-06 NOTE — Progress Notes (Signed)
   Patient is a 50 year old menopausal patient who was referred back to our practice by her PCP Dr. Urban GibsonMani as a result of her recent abnormal Pap smear. I had seen patient back in October 2017 her history is as follows:  Pap smear 2012 ASCUS HPV detected Normal Pap smear 2014 Pap smear 2016 negative for dysplasia but positive high-risk HPV and negative for GC and Chlamydia culture. 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  LOW-GRADE SQUAMOUS INTRAEPITHELIAL LESION, MILD DYSPLASIA, AT LEAST, IS  PRESENT. CELLS SUSPICIOUS FOR A HIGH-GRADE LESION ARE ALSO PRESENT with negative GC and Chlamydia  Patient underwent a detail colposcopic evaluation external genitalia, perineum perirectal region no lesions seen. The speculum was introduced into the vagina systematic inspection did not demonstrate any lesions and the vaginal wall or the ectocervix were fornix. Acetic as was applied and no lesions were seen. She has a slightly stenotic os and endocervical speculum was not able to be introduced and with negative findings she was counseled to undergo a LEEP conization the office.  LEEP (Leep electrosurgical excision procedure)    Patient Name:Chianti D Medellin  Record [phone]:[id number]  Indication For Surgery: LGSIL negative colposcopy  Surgeon: Reynaldo MiniumFERNANDEZ,JUAN H  Anesthesia: 2% lidocaine paracervical block   Procedure:  LEEP (Loop electrosurgical excision procedure) Description of Operation:  After the patient was verbally counseled the patient was placed in the low lithotomy position.  A coated speculum was inserted into the vagina and colposcopic examination was performed with 4% acidic acid with findings noted above.  The cervix was then painted with  Lugol's solution to delineate the margins of the lesion and transformation zone.  Approximately 5 cc's of 2% xylocaine with epinephrine was infiltrated deep near the outer margin of the transformation zone circumferentially at 12, 3, 6, and 9 o'clock positions.  The [facility] [employer] Generator was then turned on after the patient was grounded with pad electrode on her thigh and jewelry removed.  The settings on the generator were Blend 1 current 70 watts cut and 70 watts on the coagulation mode.  A size 20 x 12 loop electrode was utilized to exercise the atypical transformation zone.  The tip of the electrode was placed 3 mm from the edge of the lesion at 3, 6, 9 and 12 o'clock position.  The electrode was moved slowly over the lesion was within the loop limits.  A vaginal wall retractor was used.  The loop was then repositioned and the finger switch on the hand held piece was activated ( or footpedal depressed).  A slight pressure on the shaft was applied and the loop was extended into the tissue up to its crossbar to a depth of 6 mm, then with steady, slow motion across and underneath the endocervical button was excised.  The loop electrode was replaced with ball electrode set at 50 watts and the base of the crater was fulgurated circumferentially.  Monsell's paint was then applied for additional hemostasis.    Patient tolerated the procedure well with minimal blood loss and without any complications.  After the procedure patient left office with stable vital signs and instructions sheet.   [facility] Center[facility]NAN[facility],JUAN HMD11:15 AMTD@

## 2016-09-23 DIAGNOSIS — Z87411 Personal history of vaginal dysplasia: Secondary | ICD-10-CM

## 2016-09-23 HISTORY — DX: Personal history of vaginal dysplasia: Z87.411

## 2016-09-26 ENCOUNTER — Ambulatory Visit (INDEPENDENT_AMBULATORY_CARE_PROVIDER_SITE_OTHER): Payer: 59 | Admitting: Gynecology

## 2016-09-26 ENCOUNTER — Encounter: Payer: Self-pay | Admitting: Gynecology

## 2016-09-26 VITALS — BP 138/88

## 2016-09-26 DIAGNOSIS — Z09 Encounter for follow-up examination after completed treatment for conditions other than malignant neoplasm: Secondary | ICD-10-CM

## 2016-09-26 MED ORDER — CLINDAMYCIN PHOSPHATE 2 % VA CREA: 1.0000 | TOPICAL_CREAM | Freq: Every day | VAGINAL | 0 refills | Status: DC

## 2016-09-26 NOTE — Progress Notes (Signed)
   Patient is a 51 year old that presented to the office today for her 3 week postop visit. 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 She is doing well asymptomatic. Her history as follows:  Pap smear 2012 ASCUS HPV detected Normal Pap smear 2014 Pap smear 2016 negative for dysplasia but positive high-risk HPV and negative for GC and Chlamydia culture. 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  LOW-GRADE SQUAMOUS INTRAEPITHELIAL LESION, MILD DYSPLASIA, AT LEAST, IS  PRESENT. CELLS SUSPICIOUS FOR A HIGH-GRADE LESION ARE ALSO PRESENT with negative GC and Chlamydia  Patient underwent a detail colposcopic evaluation external genitalia, perineum perirectal region no lesions seen. The speculum was introduced into the vagina systematic inspection did not demonstrate any lesions and the vaginal wall or the ectocervix were fornix. Acetic as was applied and no lesions were seen. She has a slightly stenotic os and endocervical speculum was not able to be introduced and with negative findings she was counseled to undergo a LEEP conization the office  Pathology report from LEEP cervical conization: Surgery, LEEP: Negative for dysplasia and malignancy benign cervical tissue Cervical bed and, LEEP: Negative for dysplasia and malignancy benign endocervical tissue  Exam: Cervical bed not completely healed friable on contact sore nitrate and Monsel was applied.  Assessment/plan: Patient with history of recurrent abnormal Pap smear see previous note for detail recently cervical conization benign. Recommended follow-up in 3 weeks for final postop visit and then repeat Pap smear with HPV screen in 12  months. She was prescribed Cleocin vaginal cream to apply intravaginally daily at bedtime for one week if she been 6 years any vaginal odor or discharge.

## 2016-10-18 ENCOUNTER — Ambulatory Visit: Payer: 59 | Admitting: Gynecology

## 2016-10-18 ENCOUNTER — Encounter: Payer: Self-pay | Admitting: Gynecology

## 2016-10-18 ENCOUNTER — Ambulatory Visit (INDEPENDENT_AMBULATORY_CARE_PROVIDER_SITE_OTHER): Payer: 59 | Admitting: Gynecology

## 2016-10-18 VITALS — BP 130/80 | Ht 66.0 in | Wt 213.0 lb

## 2016-10-18 DIAGNOSIS — R87612 Low grade squamous intraepithelial lesion on cytologic smear of cervix (LGSIL): Secondary | ICD-10-CM | POA: Diagnosis not present

## 2016-10-18 DIAGNOSIS — N898 Other specified noninflammatory disorders of vagina: Secondary | ICD-10-CM

## 2016-10-18 NOTE — Addendum Note (Signed)
Addended by: Kem ParkinsonBARNES, Muriel Hannold on: 10/18/2016 04:38 PM   Modules accepted: Orders

## 2016-10-18 NOTE — Addendum Note (Signed)
Addended by: Kem ParkinsonBARNES, Reneta Niehaus on: 10/18/2016 04:44 PM   Modules accepted: Orders

## 2016-10-18 NOTE — Progress Notes (Signed)
   Patient is a 51 year old who presented to the office for her final 6 week postop visit. Her history as follows:  On December 15 patient had LEEP cervical cone procedure as a result of her recurrent low-grade dysplasia. She is doing well asymptomatic. Her history as follows:  Pap smear 2012 ASCUS HPV detected Normal Pap smear 2014 Pap smear 2016 negative for dysplasia but positive high-risk HPV and negative for GC and Chlamydia culture. 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  LOW-GRADE SQUAMOUS INTRAEPITHELIAL LESION, MILD DYSPLASIA, AT LEAST, IS  PRESENT. CELLS SUSPICIOUS FOR A HIGH-GRADE LESION ARE ALSO PRESENTwith negative GC and Chlamydia  Patient underwent a detail colposcopic evaluation external genitalia, perineum perirectal region no lesions seen. The speculum was introduced into the vagina systematic inspection did not demonstrate any lesions and the vaginal wall or the ectocervix were fornix. Acetic as was applied and no lesions were seen. She has a slightly stenotic os and endocervical speculum was not able to be introduced and with negative findings she was counseled to undergo a LEEP conization the office  Pathology report from LEEP cervical conization: Surgery, LEEP: Negative for dysplasia and malignancy benign cervical tissue Cervical bed and, LEEP: Negative for dysplasia and malignancy benign endocervical tissue   Exam: Pelvic: Bartholin urethra Skene was within normal limits Vagina posterior vaginal fornix there was a raised verrucous like area but her cervix was completely healed. 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 This area was biopsied and  submitted for histological evaluation. Silver nitrate and Monsel solution was applied. Most of the area was removed with a biopsy Kevorkian instrument we'll see what the pathology report demonstrates.  Assessment/plan:Patient with history of recurrent abnormal Pap smear see previous note for detail recently cervical conization benign new areas suspicious for condyloma was noted the posterior vaginal fornix which was biopsied report pending at time of this dictation.

## 2016-10-18 NOTE — Patient Instructions (Signed)

## 2016-10-24 DIAGNOSIS — N891 Moderate vaginal dysplasia: Secondary | ICD-10-CM

## 2016-10-24 HISTORY — DX: Moderate vaginal dysplasia: N89.1

## 2016-10-31 ENCOUNTER — Telehealth: Payer: Self-pay | Admitting: *Deleted

## 2016-10-31 ENCOUNTER — Encounter: Payer: Self-pay | Admitting: Gynecology

## 2016-10-31 ENCOUNTER — Ambulatory Visit (INDEPENDENT_AMBULATORY_CARE_PROVIDER_SITE_OTHER): Payer: 59 | Admitting: Gynecology

## 2016-10-31 VITALS — BP 138/80 | Ht 66.0 in | Wt 219.0 lb

## 2016-10-31 DIAGNOSIS — N891 Moderate vaginal dysplasia: Secondary | ICD-10-CM | POA: Diagnosis not present

## 2016-10-31 DIAGNOSIS — Z23 Encounter for immunization: Secondary | ICD-10-CM

## 2016-10-31 NOTE — Telephone Encounter (Signed)
-----   Message from Ok EdwardsJuan H Fernandez, MD sent at 10/31/2016  2:58 PM EST ----- Please call in prescription for Imiquimod 2.5% cream to apply M W  F for 2 months.

## 2016-10-31 NOTE — Progress Notes (Signed)
  Patient is a 51 year old that presented to the office to discuss her vaginal fourchette biopsy that was done on January 26 of this year. Her history is as follows:  She was seen on January 26 for her six-week postop visit after LEEP cervical conization.  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 She is doing well asymptomatic. Her history as follows:  Pap smear 2012 ASCUS HPV detected Normal Pap smear 2014 Pap smear 2016 negative for dysplasia but positive high-risk HPV and negative for GC and Chlamydia culture. 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  LOW-GRADE SQUAMOUS INTRAEPITHELIAL LESION, MILD DYSPLASIA, AT LEAST, IS  PRESENT. CELLS SUSPICIOUS FOR A HIGH-GRADE LESION ARE ALSO PRESENTwith negative GC and Chlamydia  Patient underwent a detail colposcopic evaluation external genitalia, perineum perirectal region no lesions seen. The speculum was introduced into the vagina systematic inspection did not demonstrate any lesions and the vaginal wall or the ectocervix were fornix. Acetic as was applied and no lesions were seen. She has a slightly stenotic os and endocervical speculum was not able to be introduced and with negative findings she was counseled to undergo a LEEP conization the office  Pathology report from LEEP cervical conization: Surgery, LEEP: Negative for dysplasia and malignancy benign cervical tissue Cervical bed and, LEEP: Negative for dysplasia and malignancy benign endocervical tissue   The time of the exam on January 26 she was noted to have at the area the vaginal fornix a a raised verrucous like area which was biopsied and pathology report demonstrated  the following: Diagnosis Vagina, biopsy, posterior, fornix 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 She would like to try the topical treatment first and if persistent or recurrent sent to follow-up with treatment site as with CO2 laser ablation. I have explained to her that at this area clears that I would recommend follow-up every 6 months for 2 years with colposcopic evaluation. The risks benefits and pros and cons of the medication to include irritation were discussed. Patient to return back to the office in 10 weeks for a follow-up after treatment.  Greater than 90% time was spent counseling coordinate and care for this patient with vaginal dysplasia. Time of consultation 15 minutes

## 2016-10-31 NOTE — Patient Instructions (Addendum)
Influenza Virus Vaccine (Flucelvax) What is this medicine? INFLUENZA VIRUS VACCINE (in floo EN zuh VAHY ruhs vak SEEN) helps to reduce the risk of getting influenza also known as the flu. The vaccine only helps protect you against some strains of the flu. COMMON BRAND NAME(S): FLUCELVAX What should I tell my health care provider before I take this medicine? They need to know if you have any of these conditions: -bleeding disorder like hemophilia -fever or infection -Guillain-Barre syndrome or other neurological problems -immune system problems -infection with the human immunodeficiency virus (HIV) or AIDS -low blood platelet counts -multiple sclerosis -an unusual or allergic reaction to influenza virus vaccine, other medicines, foods, dyes or preservatives -pregnant or trying to get pregnant -breast-feeding How should I use this medicine? This vaccine is for injection into a muscle. It is given by a health care professional. A copy of Vaccine Information Statements will be given before each vaccination. Read this sheet carefully each time. The sheet may change frequently. Talk to your pediatrician regarding the use of this medicine in children. Special care may be needed. Overdosage: If you think you've taken too much of this medicine contact a poison control center or emergency room at once. What if I miss a dose? This does not apply. What may interact with this medicine? -chemotherapy or radiation therapy -medicines that lower your immune system like etanercept, anakinra, infliximab, and adalimumab -medicines that treat or prevent blood clots like warfarin -phenytoin -steroid medicines like prednisone or cortisone -theophylline -vaccines What should I watch for while using this medicine? Report any side effects that do not go away within 3 days to your doctor or health care professional. Call your health care provider if any unusual symptoms occur within 6 weeks of receiving this  vaccine. You may still catch the flu, but the illness is not usually as bad. You cannot get the flu from the vaccine. The vaccine will not protect against colds or other illnesses that may cause fever. The vaccine is needed every year. What side effects may I notice from receiving this medicine? Side effects that you should report to your doctor or health care professional as soon as possible: -allergic reactions like skin rash, itching or hives, swelling of the face, lips, or tongue Side effects that usually do not require medical attention (Report these to your doctor or health care professional if they continue or are bothersome.): -fever -headache -muscle aches and pains -pain, tenderness, redness, or swelling at the injection site -tiredness Where should I keep my medicine? The vaccine will be given by a health care professional in a clinic, pharmacy, doctor's office, or other health care setting. You will not be given vaccine doses to store at home.  2017 Elsevier/Gold Standard (2011-08-21 14:06:47) Imiquimod skin cream What is this medicine? IMIQUIMOD (i mi KWI mod) cream is used to treat external genital or anal warts. It is also used to treat other skin conditions such as actinic keratosis and certain types of skin cancer. This medicine may be used for other purposes; ask your health care provider or pharmacist if you have questions. COMMON BRAND NAME(S): Celesta Aver What should I tell my health care provider before I take this medicine? They need to know if you have any of these conditions: -decreased immune function -an unusual or allergic reaction to imiquimod, other medicines, foods, dyes, or preservatives -pregnant or trying to get pregnant -breast-feeding How should I use this medicine? This medicine is for external use only. Do not take  by mouth. Follow the directions on the prescription label. Apply just before bedtime. Wash your hands before and after use. Apply a  thin layer of cream and massage gently into the affected areas until no longer visible. Do not use in the mouth, eyes or the vagina. Use this medicine only on the affected area as directed by your health care provider. Do not use for longer than prescribed. It is important not to use more medicine than prescribed. To do so may increase the chance of side effects. Talk to your pediatrician regarding the use of this medicine in children. While this drug may be prescribed for children as young as 72 years of age for selected conditions, precautions do apply. Overdosage: If you think you have taken too much of this medicine contact a poison control center or emergency room at once. NOTE: This medicine is only for you. Do not share this medicine with others. What if I miss a dose? If you miss a dose, use it as soon as you can. If it is almost time for your next dose, use only that dose. Do not use double or extra doses. What may interact with this medicine? Interactions are not expected. Do not use any other medicines on the treated area without asking your doctor or health care professional. This list may not describe all possible interactions. Give your health care provider a list of all the medicines, herbs, non-prescription drugs, or dietary supplements you use. Also tell them if you smoke, drink alcohol, or use illegal drugs. Some items may interact with your medicine. What should I watch for while using this medicine? Visit your health care professional for regular checks on your progress. Do not use this medicine until the skin has healed from any other drug (example: podofilox or podophyllin resin) or surgical skin treatment. Females should receive regular pelvic exams while being treated for genital warts. Most patients see improvement within 4 weeks. It may take up to 16 weeks to see a full clearing of the warts. This medicine is not a cure. New warts may develop during or after treatment. Avoid  sexual (genital, anal, oral) contact while the cream is on the skin. If warts are visible in the genital area, sexual contact should be avoided until the warts are treated. The use of latex condoms during sexual contact may reduce, but not entirely prevent, infecting others. This medicine may weaken condoms, diaphragms, cervical caps or other barrier devices and make them less effective as birth control. Do not cover the treated area with an airtight bandage. Cotton gauze dressings can be used. Cotton underwear can be worn after using this medicine on the genital or anal area. Actinic keratoses that were not seen before may appear during treatment and may later go away. The treatment area and surrounding area may lighten or darken after treatment with this medicine. These skin color changes may be permanent in some patients. If you experience a skin reaction at the treatment site that interferes or prevents you from doing any daily activity, contact your health care provider. You may need a rest period from treatment. Treatment may be restarted once the reaction has gotten better as recommended by your doctor or health care professional. This medicine can make you more sensitive to the sun. Keep out of the sun. If you cannot avoid being in the sun, wear protective clothing and use sunscreen. Do not use sun lamps or tanning beds/booths. What side effects may I notice from receiving this  medicine? Side effects that you should report to your doctor or health care professional as soon as possible: -open sores with or without drainage -skin infection -skin rash -unusual or severe skin reaction Side effects that usually do not require medical attention (report to your doctor or health care professional if they continue or are bothersome): -burning or itching -redness of the skin (very common but is usually not painful or harmful) -scabbing, crusting, or peeling skin -skin that becomes hard or  thickened -swelling of the skin This list may not describe all possible side effects. Call your doctor for medical advice about side effects. You may report side effects to FDA at 1-800-FDA-1088. Where should I keep my medicine? Keep out of the reach of children. Store between 4 and 25 degrees C (39 and 77 degrees F). Do not freeze. Throw away any unused medicine after the expiration date. Discard packet after applying to affected area. Partial packets should not be saved or reused. NOTE: This sheet is a summary. It may not cover all possible information. If you have questions about this medicine, talk to your doctor, pharmacist, or health care provider.  2017 Elsevier/Gold Standard (2008-08-23 10:33:25)

## 2016-11-01 MED ORDER — IMIQUIMOD 2.5 % EX CREA: TOPICAL_CREAM | CUTANEOUS | 0 refills | Status: DC

## 2016-11-01 NOTE — Telephone Encounter (Signed)
Rx sent 

## 2016-11-22 ENCOUNTER — Encounter: Payer: Self-pay | Admitting: Gynecology

## 2016-11-22 ENCOUNTER — Ambulatory Visit (INDEPENDENT_AMBULATORY_CARE_PROVIDER_SITE_OTHER): Payer: 59 | Admitting: Gynecology

## 2016-11-22 VITALS — BP 134/80

## 2016-11-22 DIAGNOSIS — N891 Moderate vaginal dysplasia: Secondary | ICD-10-CM

## 2016-11-22 NOTE — Progress Notes (Signed)
   Patient presented to the office with questions on the medication she was prescribed recently (Aldara cream) for her VAIN II. She had read the potential side effects and risks was concerned. Her history is as follows:  Patient is a 51 year old that was seen in the office on February 8 to discuss her vaginal fourchette biopsy that was done on January 26 of this year. Her history is as follows:  She was seen on January 26 for her six-week postop visit after LEEP cervical conization.  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 She is doing well asymptomatic. Her history as follows:  Pap smear 2012 ASCUS HPV detected Normal Pap smear 2014 Pap smear 2016 negative for dysplasia but positive high-risk HPV and negative for GC and Chlamydia culture. 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  LOW-GRADE SQUAMOUS INTRAEPITHELIAL LESION, MILD DYSPLASIA, AT LEAST, IS  PRESENT. CELLS SUSPICIOUS FOR A HIGH-GRADE LESION ARE ALSO PRESENTwith negative GC and Chlamydia  Patient underwent a detail colposcopic evaluation external genitalia, perineum perirectal region no lesions seen. The speculum was introduced into the vagina systematic inspection did not demonstrate any lesions and the vaginal wall or the ectocervix were fornix. Acetic as was applied and no lesions were seen. She has a slightly stenotic os and endocervical speculum was not able to be introduced and with negative findings she was counseled to undergo a LEEP conization the office  Pathology report from LEEP cervical conization: Surgery, LEEP: Negative for dysplasia and malignancy benign cervical tissue Cervical bed and,  LEEP: Negative for dysplasia and malignancy benign endocervical tissue   The time of the exam on January 26 she was noted to have at the area the vaginal fornix a a raised verrucous like area which was biopsied and pathology report demonstrated the following: Diagnosis Vagina, biopsy, posterior, fornix 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 She would like to try the topical treatment first and if persistent or recurrent sent to follow-up with treatment site as with CO2 laser ablation. I have explained to her that at this area clears that I would recommend follow-up every 6 months for 2 years with colposcopic evaluation. The risks benefits and pros and cons of the medication to include irritation were discussed. Patient to return back to the office in 10 weeks for a follow-up after treatment.  I reassured her that the hyperpigmented side effect it was described was very unlikely for the short amount of time that she was going to apply the cream. Once again discussed that she needs to applied Monday Wednesday and Friday for 2 months and then return to the office for follow-up.  Greater than 90% time was spent counseling quantity care of this patient. Time of consultation 10 minutes

## 2016-12-02 ENCOUNTER — Other Ambulatory Visit: Payer: Self-pay | Admitting: Family Medicine

## 2016-12-02 DIAGNOSIS — Z1231 Encounter for screening mammogram for malignant neoplasm of breast: Secondary | ICD-10-CM

## 2017-01-06 ENCOUNTER — Ambulatory Visit: Payer: 59

## 2017-01-31 ENCOUNTER — Ambulatory Visit: Payer: 59 | Admitting: Gynecology

## 2017-02-05 ENCOUNTER — Encounter: Payer: Self-pay | Admitting: Gynecology

## 2017-02-21 ENCOUNTER — Ambulatory Visit (INDEPENDENT_AMBULATORY_CARE_PROVIDER_SITE_OTHER): Payer: 59 | Admitting: Gynecology

## 2017-02-21 ENCOUNTER — Encounter: Payer: Self-pay | Admitting: Gynecology

## 2017-02-21 VITALS — BP 126/80

## 2017-02-21 DIAGNOSIS — N891 Moderate vaginal dysplasia: Secondary | ICD-10-CM

## 2017-02-21 MED ORDER — IMIQUIMOD 5 % EX CREA: TOPICAL_CREAM | CUTANEOUS | 1 refills | Status: DC

## 2017-02-21 NOTE — Progress Notes (Addendum)
   Patient presented to the office today for follow-up after applying Aldara cream to the area the fourchette for VAIN I. she has tolerated the medicine well. Her history is as follows:   Patient is a 51 year old that was seen in the office on February 8 to discuss her vaginal fourchette biopsy that was done on January 26 of this year. Her history is as follows:  She was seen on January 26 for her six-week postop visit after LEEP cervical conization.  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 She is doing well asymptomatic. Her history as follows:  Pap smear 2012 ASCUS HPV detected Normal Pap smear 2014 Pap smear 2016 negative for dysplasia but positive high-risk HPV and negative for GC and Chlamydia culture. 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  LOW-GRADE SQUAMOUS INTRAEPITHELIAL LESION, MILD DYSPLASIA, AT LEAST, IS  PRESENT. CELLS SUSPICIOUS FOR A HIGH-GRADE LESION ARE ALSO PRESENTwith negative GC and Chlamydia  Patient underwent a detail colposcopic evaluation external genitalia, perineum perirectal region no lesions seen. The speculum was introduced into the vagina systematic inspection did not demonstrate any lesions and the vaginal wall or the ectocervix were fornix. Acetic as was applied and no lesions were seen. She has a slightly stenotic os and endocervical speculum was not able to be introduced and with negative findings she was counseled to undergo a LEEP conization the office  Pathology report from LEEP cervical conization: Surgery, LEEP: Negative for dysplasia and malignancy benign cervical tissue Cervical bed and, LEEP: Negative for dysplasia and malignancy  benign endocervical tissue   The time of the exam on January 26 she was noted to have at the area the vaginal fornix a a raised verrucous like area which was biopsied and pathology report demonstrated the following: Diagnosis Vagina, biopsy, posterior, fornix 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 The risks benefits and pros and cons of the medication to include irritation were discussed.  Patient underwent detail colposcopic evaluation external genitalia, perineum and perirectal region. The area fourchette that was recently seen that had the VAIN II leukoplakic area is 90% healed.  Speculum was introduced into the vagina no lesions were noted cervix no lesions were seen fornix no lesions seen  Assessment/plan: Patient with history of low-grade squamous intraepithelial lesion cervical LEEP conization with no dysplasia seen. 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 patient was placed on Aldara cream to apply 3 times a week for 2 months 90% completely gone we'll give her an additional 2 more months and return to the office for follow-up. She'll also need a follow-up Pap smear in December this year.

## 2017-02-21 NOTE — Patient Instructions (Signed)
Colposcopy, Care After  This sheet gives you information about how to care for yourself after your procedure. Your doctor may also give you more specific instructions. If you have problems or questions, contact your doctor.  What can I expect after the procedure?  If you did not have a tissue sample removed (did not have a biopsy), you may only have some spotting for a few days. You can go back to your normal activities.  If you had a tissue sample removed, it is common to have:  · Soreness and pain. This may last for a few days.  · Light-headedness.  · Mild bleeding from your vagina or dark-colored, grainy discharge from your vagina. This may last for a few days. You may need to wear a sanitary pad.  · Spotting for at least 48 hours after the procedure.    Follow these instructions at home:  · Take over-the-counter and prescription medicines only as told by your doctor. Ask your doctor what medicines you can start taking again. This is very important if you take blood-thinning medicine.  · Do not drive or use heavy machinery while taking prescription pain medicine.  · For 3 days, or as long as your doctor tells you, avoid:  ? Douching.  ? Using tampons.  ? Having sex.  · If you use birth control (contraception), keep using it.  · Limit activity for the first day after the procedure. Ask your doctor what activities are safe for you.  · It is up to you to get the results of your procedure. Ask your doctor when your results will be ready.  · Keep all follow-up visits as told by your doctor. This is important.  Contact a doctor if:  · You get a skin rash.  Get help right away if:  · You are bleeding a lot from your vagina. It is a lot of bleeding if you are using more than one pad an hour for 2 hours in a row.  · You have clumps of blood (blood clots) coming from your vagina.  · You have a fever.  · You have chills  · You have pain in your lower belly (pelvic area).  · You have signs of infection, such as vaginal  discharge that is:  ? Different than usual.  ? Yellow.  ? Bad-smelling.  · You have very pain or cramps in your lower belly that do not get better with medicine.  · You feel light-headed.  · You feel dizzy.  · You pass out (faint).  Summary  · If you did not have a tissue sample removed (did not have a biopsy), you may only have some spotting for a few days. You can go back to your normal activities.  · If you had a tissue sample removed, it is common to have mild pain and spotting for 48 hours.  · For 3 days, or as long as your doctor tells you, avoid douching, using tampons and having sex.  · Get help right away if you have bleeding, very bad pain, or signs of infection.  This information is not intended to replace advice given to you by your health care provider. Make sure you discuss any questions you have with your health care provider.  Document Released: 02/26/2008 Document Revised: 05/29/2016 Document Reviewed: 05/29/2016  Elsevier Interactive Patient Education © 2018 [employer]

## 2017-03-03 ENCOUNTER — Ambulatory Visit
Admission: RE | Admit: 2017-03-03 | Discharge: 2017-03-03 | Disposition: A | Discharge status: Home / Self Care | Payer: 59 | Source: Ambulatory Visit | Attending: Family Medicine | Admitting: Family Medicine

## 2017-03-03 DIAGNOSIS — Z1231 Encounter for screening mammogram for malignant neoplasm of breast: Secondary | ICD-10-CM

## 2017-04-23 ENCOUNTER — Ambulatory Visit (INDEPENDENT_AMBULATORY_CARE_PROVIDER_SITE_OTHER): Payer: 59 | Admitting: Gynecology

## 2017-04-23 ENCOUNTER — Encounter: Payer: Self-pay | Admitting: Gynecology

## 2017-04-23 VITALS — BP 118/72 | Wt 226.2 lb

## 2017-04-23 DIAGNOSIS — N891 Moderate vaginal dysplasia: Secondary | ICD-10-CM | POA: Diagnosis not present

## 2017-04-23 NOTE — Patient Instructions (Signed)
Cervical Biopsy  A cervical biopsy is a procedure to remove a small sample of tissue from the cervix. The cervix is the lowest part of the womb (uterus), which opens into the vagina (birth canal).  You may have this procedure to check for cancer or growths that may become cancer. This procedure may also be done if the results of your Pap test were abnormal or if your health care provider saw an abnormality during a pelvic exam.  Tell a health care provider about:   Any allergies you have.   All medicines you are taking, including vitamins, herbs, eye drops, creams, and over-the-counter medicines.   Any problems you or family members have had with anesthetic medicines.   Any blood disorders you have.   Any surgeries you have had.   Any medical conditions you have.   Whether you are pregnant or may be pregnant.   Whether you are having your menstrual period or will be having your period at the time of the procedure.  What are the risks?  Generally, this is a safe procedure. However, problems may occur, including:   Infection.   Bleeding.   Allergic reactions to medicines or dyes.   Damage to other structures or organs.    What happens before the procedure?   Do not douche, have sex, use tampons, or use any vaginal medicines before the procedure as told by your health care provider.   Follow instructions from your health care provider about eating or drinking restrictions.   Ask your health care provider about:  ? Changing or stopping your regular medicines. This is especially important if you are taking diabetes medicines or blood thinners.  ? Taking medicines such as aspirin and ibuprofen. These medicines can thin your blood. Do not take these medicines before your procedure if your health care provider instructs you not to.   You may be given an over-the-counter pain medicine to take right before the procedure.   You may be asked to empty your bladder and bowel right before the procedure.  What  happens during the procedure?   You will undress from the waist down.   You will lie on an examining table and put your feet in stirrups.   To reduce your risk of infection:  ? Your health care team will wash or sanitize their hands.  ? Your skin will be washed with soap.   Your health care provider will use a lubricated instrument (speculum) to open your vagina. An instrument that has a magnifying lens and a light (colposcope) will let your health care provider examine the cervix more closely.   You may be given a medicine to numb the area (local anesthetic).   Your health care provider will apply a solution to your cervix. This turns abnormal areas a pale color.   Your health care provider will use an instrument (biopsy forceps) to take one or more small pieces of tissue that appear abnormal.   If there seems to be an abnormal area in the part of your cervix that leads to the uterus (endocervical canal), your health care provider will use an instrument (curette) to scrape tissue from that area. This is called endocervical curettage.   Your health care provider will apply a paste over the biopsy areas to help control bleeding.  The procedure may vary among health care providers and hospitals.  What happens after the procedure?  It is your responsibility to get the results of your procedure. Ask your   health care provider or the department performing the procedure when your results will be ready.  This information is not intended to replace advice given to you by your health care provider. Make sure you discuss any questions you have with your health care provider.  Document Released: 09/09/2005 Document Revised: 01/18/2016 Document Reviewed: 01/25/2015  Elsevier Interactive Patient Education  2018 [employer]vier Inc[employer]

## 2017-04-23 NOTE — Progress Notes (Signed)
Patient is a 51 year old that presented to the office for follow-up colposcopic evaluation after being treated for VAIN with Aldara cream for several months. Her history is as follows:  Note from 02/21/2017: " Patient presented to the office today for follow-up after applying Aldara cream to the area the fourchette for VAIN I. she has tolerated the medicine well. Her history is as follows:   Patient is a 51 year old that was seen in the office on February 8 to discuss her vaginal fourchette biopsy that was done on January 26 of this year. Her history is as follows:  She was seen on January 26 for her six-week postop visit after LEEP cervical conization.  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 She is doing well asymptomatic. Her history as follows:  Pap smear 2012 ASCUS HPV detected Normal Pap smear 2014 Pap smear 2016 negative for dysplasia but positive high-risk HPV and negative for GC and Chlamydia culture. 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  LOW-GRADE SQUAMOUS INTRAEPITHELIAL LESION, MILD DYSPLASIA, AT LEAST, IS  PRESENT. CELLS SUSPICIOUS FOR A HIGH-GRADE LESION ARE ALSO PRESENTwith negative GC and Chlamydia  Patient underwent a detail colposcopic evaluation external genitalia, perineum perirectal region no lesions seen. The speculum was introduced into the vagina systematic inspection did not demonstrate any lesions and the vaginal wall or the ectocervix were fornix. Acetic as was applied and no lesions were seen. She has a slightly stenotic os and endocervical speculum was not able to be introduced and with negative findings she was counseled to undergo a LEEP  conization the office  Pathology report from LEEP cervical conization: Surgery, LEEP: Negative for dysplasia and malignancy benign cervical tissue Cervical bed and, LEEP: Negative for dysplasia and malignancy benign endocervical tissue   The time of the exam on January 26 she was noted to have at the area the vaginal fornix a a raised verrucous like area which was biopsied and pathology report demonstrated the following: Diagnosis Vagina, biopsy, posterior, fornix 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 The risks benefits and pros and cons of the medication to include irritation were discussed.  Patient underwent detail colposcopic evaluation external genitalia, perineum and perirectal region. The area fourchette that was recently seen that had the VAIN II leukoplakic area is 90% healed.  Speculum was introduced into the vagina no lesions were noted cervix no lesions were seen fornix no lesions seen  Assessment/plan: Patient with history of low-grade squamous intraepithelial lesion cervical LEEP conization with no dysplasia seen. 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 patient was placed on Aldara cream to apply 3 times a week for 2 months 90% completely gone we'll give her an additional 2 more months and return to the office for follow-up. She'll also need a follow-up Pap smear in December this year."  Patient underwent a detail colposcopic evaluation today at the external genitalia, perineum, perirectal region. Acetic acid was applied flulike filter was utilized leukoplakic areas seen before no longer present. Following this a speculum was introduced into the vagina systemic and is  action of the vagina, fornix and cervix with no abnormality noted even after applying acetic  acid.  Assessment/plan: 51 year old lady treated for VAIN II with Aldara cream for several months has responded no abnormality noted. It is recommended she undergo colposcopic evaluation external genitalia least once a year. She is due for her annual exam and Pap smear this December.

## 2017-06-24 ENCOUNTER — Ambulatory Visit (INDEPENDENT_AMBULATORY_CARE_PROVIDER_SITE_OTHER): Payer: 59 | Admitting: Urgent Care

## 2017-06-24 ENCOUNTER — Encounter: Payer: Self-pay | Admitting: Urgent Care

## 2017-06-24 DIAGNOSIS — R030 Elevated blood-pressure reading, without diagnosis of hypertension: Secondary | ICD-10-CM

## 2017-06-24 DIAGNOSIS — I10 Essential (primary) hypertension: Secondary | ICD-10-CM

## 2017-06-24 NOTE — Patient Instructions (Addendum)
Salads - kale, spinach, cabbage, spring mix; use seeds like pumpkin seeds or sunflower seeds; you can also use 1-2 hard boiled eggs. Fruits - avocadoes, berries (blueberries, raspberries, blackberries), apples, oranges, pomegranate, grapefruit Seeds - quinoa, chia seeds; you can also incorporate oatmeal Vegetables - aspargus, cauliflower, broccoli, green beans, brussel spouts, bell peppers; stay away from starchy vegetables like potatoes, carrots, peas  Brussel sprouts - Cut off stems. Place in a mixing bowl that has a lid. Pour in a 1/4-1/2 cup olive oil, spices, use a light amount of parmesan. Place on a baking sheet. Bake for 10 minutes at 400F. Take it out, eat the brussel chips. Place for another 5-10 minutes.   Mashed cauliflower - Boil a bunch of cauliflower in a pot of water. Blend in a food processor with 1-2 tablespoons of butter.  Spaghetti squash -  Cut the squash in half very carefully, clean out seeds from the middle. Place 1/2 face down in a microwave safe dish with at least 2 inches of water. Make 4-6 slits on outside of spaghetti squash and microwave for 10-12 minutes. Take out the spaghetti using a metal spoon. Repeat for the other half.   Vega protein is good protein powder, make sure you use ~6 ice cubes to give it smoothie consistency together with ~4-6 ounces of vanilla soy milk. Throw cinnamon into your shake, use peanut butter. You can also use the fruits that I listed above. Throw spinach or kale into the shake.      Hypertension Hypertension, commonly called high blood pressure, is when the force of blood pumping through the arteries is too strong. The arteries are the blood vessels that carry blood from the heart throughout the body. Hypertension forces the heart to work harder to pump blood and may cause arteries to become narrow or stiff. Having untreated or uncontrolled hypertension can cause heart attacks, strokes, kidney disease, and other problems. A blood pressure  reading consists of a higher number over a lower number. Ideally, your blood pressure should be below 120/80. The first ("top") number is called the systolic pressure. It is a measure of the pressure in your arteries as your heart beats. The second ("bottom") number is called the diastolic pressure. It is a measure of the pressure in your arteries as the heart relaxes. What are the causes? The cause of this condition is not known. What increases the risk? Some risk factors for high blood pressure are under your control. Others are not. Factors you can change  Smoking.  Having type 2 diabetes mellitus, high cholesterol, or both.  Not getting enough exercise or physical activity.  Being overweight.  Having too much fat, sugar, calories, or salt (sodium) in your diet.  Drinking too much alcohol. Factors that are difficult or impossible to change  Having chronic kidney disease.  Having a family history of high blood pressure.  Age. Risk increases with age.  Race. You may be at higher risk if you are African-American.  Gender. Men are at higher risk than women before age 45. After age 65, women are at higher risk than men.  Having obstructive sleep apnea.  Stress. What are the signs or symptoms? Extremely high blood pressure (hypertensive crisis) may cause:  Headache.  Anxiety.  Shortness of breath.  Nosebleed.  Nausea and vomiting.  Severe chest pain.  Jerky movements you cannot control (seizures).  How is this diagnosed? This condition is diagnosed by measuring your blood pressure while you are seated, with your   arm resting on a surface. The cuff of the blood pressure monitor will be placed directly against the skin of your upper arm at the level of your heart. It should be measured at least twice using the same arm. Certain conditions can cause a difference in blood pressure between your right and left arms. Certain factors can cause blood pressure readings to be  lower or higher than normal (elevated) for a short period of time:  When your blood pressure is higher when you are in a health care provider's office than when you are at home, this is called white coat hypertension. Most people with this condition do not need medicines.  When your blood pressure is higher at home than when you are in a health care provider's office, this is called masked hypertension. Most people with this condition may need medicines to control blood pressure.  If you have a high blood pressure reading during one visit or you have normal blood pressure with other risk factors:  You may be asked to return on a different day to have your blood pressure checked again.  You may be asked to monitor your blood pressure at home for 1 week or longer.  If you are diagnosed with hypertension, you may have other blood or imaging tests to help your health care provider understand your overall risk for other conditions. How is this treated? This condition is treated by making healthy lifestyle changes, such as eating healthy foods, exercising more, and reducing your alcohol intake. Your health care provider may prescribe medicine if lifestyle changes are not enough to get your blood pressure under control, and if:  Your systolic blood pressure is above 130.  Your diastolic blood pressure is above 80.  Your personal target blood pressure may vary depending on your medical conditions, your age, and other factors. Follow these instructions at home: Eating and drinking  Eat a diet that is high in fiber and potassium, and low in sodium, added sugar, and fat. An example eating plan is called the DASH (Dietary Approaches to Stop Hypertension) diet. To eat this way: ? Eat plenty of fresh fruits and vegetables. Try to fill half of your plate at each meal with fruits and vegetables. ? Eat whole grains, such as whole wheat pasta, brown rice, or whole grain bread. Fill about one quarter of your  plate with whole grains. ? Eat or drink low-fat dairy products, such as skim milk or low-fat yogurt. ? Avoid fatty cuts of meat, processed or cured meats, and poultry with skin. Fill about one quarter of your plate with lean proteins, such as fish, chicken without skin, beans, eggs, and tofu. ? Avoid premade and processed foods. These tend to be higher in sodium, added sugar, and fat.  Reduce your daily sodium intake. Most people with hypertension should eat less than 1,500 mg of sodium a day.  Limit alcohol intake to no more than 1 drink a day for nonpregnant women and 2 drinks a day for men. One drink equals 12 oz of beer, 5 oz of wine, or 1 oz of hard liquor. Lifestyle  Work with your health care provider to maintain a healthy body weight or to lose weight. Ask what an ideal weight is for you.  Get at least 30 minutes of exercise that causes your heart to beat faster (aerobic exercise) most days of the week. Activities may include walking, swimming, or biking.  Include exercise to strengthen your muscles (resistance exercise), such as pilates   or lifting weights, as part of your weekly exercise routine. Try to do these types of exercises for 30 minutes at least 3 days a week.  Do not use any products that contain nicotine or tobacco, such as cigarettes and e-cigarettes. If you need help quitting, ask your health care provider.  Monitor your blood pressure at home as told by your health care provider.  Keep all follow-up visits as told by your health care provider. This is important. Medicines  Take over-the-counter and prescription medicines only as told by your health care provider. Follow directions carefully. Blood pressure medicines must be taken as prescribed.  Do not skip doses of blood pressure medicine. Doing this puts you at risk for problems and can make the medicine less effective.  Ask your health care provider about side effects or reactions to medicines that you should  watch for. Contact a health care provider if:  You think you are having a reaction to a medicine you are taking.  You have headaches that keep coming back (recurring).  You feel dizzy.  You have swelling in your ankles.  You have trouble with your vision. Get help right away if:  You develop a severe headache or confusion.  You have unusual weakness or numbness.  You feel faint.  You have severe pain in your chest or abdomen.  You vomit repeatedly.  You have trouble breathing. Summary  Hypertension is when the force of blood pumping through your arteries is too strong. If this condition is not controlled, it may put you at risk for serious complications.  Your personal target blood pressure may vary depending on your medical conditions, your age, and other factors. For most people, a normal blood pressure is less than 120/80.  Hypertension is treated with lifestyle changes, medicines, or a combination of both. Lifestyle changes include weight loss, eating a healthy, low-sodium diet, exercising more, and limiting alcohol. This information is not intended to replace advice given to you by your health care provider. Make sure you discuss any questions you have with your health care provider. Document Released: 09/09/2005 Document Revised: 08/07/2016 Document Reviewed: 08/07/2016 Elsevier Interactive Patient Education  2018 Elsevier Inc.     IF you received an x-ray today, you will receive an invoice from [facility]. Please contact [facility] at [phone] with questions or concerns regarding your invoice.   IF you received labwork today, you will receive an invoice from [facility]. Please contact [facility] at [phone] with questions or concerns regarding your invoice.   Our billing staff will not be able to assist you with questions regarding bills from these companies.  You will be contacted with the lab results as soon as they are available. The  fastest way to get your results is to activate your My Chart account. Instructions are located on the last page of this paperwork. If you have not heard from us regarding the results in 2 weeks, please contact this office.      

## 2017-06-24 NOTE — Progress Notes (Signed)
  MRN: [id number] DOB: 1966-04-14  Subjective:   Dana Reese is a 51 y.o. female presenting for chief complaint of wellness form (need for work)  Patient had her annual physical completed by me last year, 07/20/2016. She presents today to complete a form for her insurance provider that would exempt her from her BMI being an issue for rewards with her insurance policy. Admits dieting that has not worked. She never ended up seeing a nutritionist. Regarding her blood pressure, denies dizziness, chronic headache, chest pain, shortness of breath, heart racing, palpitations, abdominal pain, hematuria, lower leg swelling. Denies smoking cigarettes or drinking alcohol.   Dana Reese has a current medication list which includes the following prescription(s): lisinopril. Also has No Known Allergies.  Dana Reese  has a past medical history of ASCUS with positive high risk HPV (05/25/2011); Chlamydia (09/23/2010); Hypertension (09/23/2005); Symptomatic mammary hypertrophy; and VAIN II (vaginal intraepithelial neoplasia grade II) (10/2016). Also  has a past surgical history that includes Tubal ligation and Colonoscopy (06/23/2012).  Objective:   Vitals: BP (!) 135/101   Pulse 81   Temp 98.1 F (36.7 C) (Oral)   Resp 18   Ht 5' 5.5" (1.664 m)   Wt 226 lb 12.8 oz (102.9 kg)   LMP 04/06/2017 (Approximate)   SpO2 98%   BMI 37.17 kg/m   Physical Exam  Constitutional: She is oriented to person, place, and time. She appears well-developed and well-nourished.  Cardiovascular: Normal rate, regular rhythm and intact distal pulses.  Exam reveals no gallop and no friction rub.   No murmur heard. Pulmonary/Chest: No respiratory distress. She has no wheezes. She has no rales.  Musculoskeletal: She exhibits no edema.  Neurological: She is alert and oriented to person, place, and time.   Assessment and Plan :   1. Morbid obesity (HCC) - Form completed, patient agreed to dietary modifications and setting up visit with  nutritionist. - Amb ref to Medical Nutrition Therapy-MNT  2. Essential hypertension 3. Elevated blood pressure reading - Patient did not want to set up OV for her BP. She prefers to wait for an annual physical. Counseled her on the risks of uncontrolled HTN. Patient verbalized understanding. - Amb ref to Medical Nutrition Therapy-MNT   Wallis Bamberg, PA-C [facility] at [facility] [phone] 06/24/2017  6:10 PM

## 2017-06-26 ENCOUNTER — Telehealth: Payer: Self-pay | Admitting: Family Medicine

## 2017-06-26 NOTE — Telephone Encounter (Signed)
I called the pt to reschedule her CPE from 07/28/17 to 08/25/17  9 I went ahead and took this slot) or anytime on 09/15/17. If pt calls back and is unable to come on the 12/3@340  please cancel this appt.If she can please cancel 07/28/17 appt. TY

## 2017-07-28 ENCOUNTER — Encounter: Payer: 59 | Admitting: Family Medicine

## 2017-08-11 ENCOUNTER — Other Ambulatory Visit: Payer: Self-pay | Admitting: Urgent Care

## 2017-08-11 ENCOUNTER — Telehealth: Payer: Self-pay | Admitting: Family Medicine

## 2017-08-11 MED ORDER — LISINOPRIL 10 MG PO TABS: 10.0000 mg | ORAL_TABLET | Freq: Every day | ORAL | 0 refills | Status: DC

## 2017-08-11 NOTE — Telephone Encounter (Signed)
Copied from CRM #[id number]. Topic: General - Other >> Aug 11, 2017  3:30 PM Harrald, Kathy J wrote: Reason for CRM: Pt request refill    lisinopril (PRINIVIL,ZESTRIL) 10 MG tablet   Pt has cpe on 12/03 back ran out of this med   [id number] - [place], [place] - [address] [phone] (Phone) [phone] (Fax)     

## 2017-08-12 ENCOUNTER — Telehealth: Payer: Self-pay | Admitting: Family Medicine

## 2017-08-12 NOTE — Telephone Encounter (Signed)
Rx was sent yesterday with 30 day supply

## 2017-08-12 NOTE — Telephone Encounter (Signed)
Copied from CRM #[id number]. Topic: General - Other >> Aug 11, 2017  3:30 PM Crist InfanteHarrald, Kathy J wrote: Reason for CRM: Pt request refill    lisinopril (PRINIVIL,ZESTRIL) 10 MG tablet   Pt has cpe on 12/03 back ran out of this med   [facility] [place] - [address] [phone] (Phone) [phone] (Fax)

## 2017-08-25 ENCOUNTER — Ambulatory Visit (INDEPENDENT_AMBULATORY_CARE_PROVIDER_SITE_OTHER): Payer: 59 | Admitting: Family Medicine

## 2017-08-25 ENCOUNTER — Other Ambulatory Visit: Payer: Self-pay

## 2017-08-25 ENCOUNTER — Encounter: Payer: Self-pay | Admitting: Family Medicine

## 2017-08-25 VITALS — BP 138/88 | HR 105 | Temp 98.0°F | Resp 16 | Ht 66.14 in | Wt 226.0 lb

## 2017-08-25 DIAGNOSIS — Z803 Family history of malignant neoplasm of breast: Secondary | ICD-10-CM

## 2017-08-25 DIAGNOSIS — Z800 Family history of malignant neoplasm of digestive organs: Secondary | ICD-10-CM | POA: Diagnosis not present

## 2017-08-25 DIAGNOSIS — Z23 Encounter for immunization: Secondary | ICD-10-CM

## 2017-08-25 DIAGNOSIS — Z131 Encounter for screening for diabetes mellitus: Secondary | ICD-10-CM

## 2017-08-25 DIAGNOSIS — I10 Essential (primary) hypertension: Secondary | ICD-10-CM

## 2017-08-25 DIAGNOSIS — Z1211 Encounter for screening for malignant neoplasm of colon: Secondary | ICD-10-CM

## 2017-08-25 DIAGNOSIS — Z13220 Encounter for screening for lipoid disorders: Secondary | ICD-10-CM | POA: Diagnosis not present

## 2017-08-25 DIAGNOSIS — N62 Hypertrophy of breast: Secondary | ICD-10-CM | POA: Diagnosis not present

## 2017-08-25 DIAGNOSIS — N891 Moderate vaginal dysplasia: Secondary | ICD-10-CM | POA: Diagnosis not present

## 2017-08-25 DIAGNOSIS — Z0000 Encounter for general adult medical examination without abnormal findings: Secondary | ICD-10-CM | POA: Diagnosis not present

## 2017-08-25 LAB — POCT URINALYSIS DIP (MANUAL ENTRY)
BILIRUBIN UA: NEGATIVE
BILIRUBIN UA: NEGATIVE mg/dL
Glucose, UA: NEGATIVE mg/dL
LEUKOCYTES UA: NEGATIVE
Nitrite, UA: NEGATIVE
PROTEIN UA: NEGATIVE mg/dL
Urobilinogen, UA: 0.2 E.U./dL
pH, UA: 5.5 (ref 5.0–8.0)

## 2017-08-25 MED ORDER — LISINOPRIL 10 MG PO TABS: 10.0000 mg | ORAL_TABLET | Freq: Every day | ORAL | 0 refills | Status: DC

## 2017-08-25 NOTE — Progress Notes (Signed)
Subjective:    Patient ID: Dana Reese, female    DOB: [birth year], 51 y.o.   MRN: [id number]  08/25/2017  Annual Exam    HPI This 51 y.o. female presents for Complete Physical Examination.  Last physical:  06-2016 Urban GibsonMani Pap smear:  09-2016; menopause Mammogram: 02-2017 Colonoscopy:  [place] 2010. Eye exam:  November 2017; glasses Dental exam:  3 months ago   R shoulder pain:  Onset two weeks ago.  R knee swelling: onset for several months. BP Readings from Last 3 Encounters:  08/25/17 138/88  06/24/17 (!) 135/101  04/23/17 118/72   Wt Readings from Last 3 Encounters:  08/25/17 226 lb (102.5 kg)  06/24/17 226 lb 12.8 oz (102.9 kg)  04/23/17 226 lb 3.2 oz (102.6 kg)   Immunization History  Administered Date(s) Administered  . Influenza,inj,Quad PF,6+ Mos 10/31/2016  . Tdap 11/21/2008    Review of Systems  Constitutional: Negative for activity change, appetite change, chills, diaphoresis, fatigue, fever and unexpected weight change.  HENT: Negative for congestion, dental problem, drooling, ear discharge, ear pain, facial swelling, hearing loss, mouth sores, nosebleeds, postnasal drip, rhinorrhea, sinus pressure, sneezing, sore throat, tinnitus, trouble swallowing and voice change.   Eyes: Positive for visual disturbance. Negative for photophobia, pain, discharge, redness and itching.  Respiratory: Negative for apnea, cough, choking, chest tightness, shortness of breath, wheezing and stridor.   Cardiovascular: Positive for leg swelling. Negative for chest pain and palpitations.  Gastrointestinal: Negative for abdominal distention, abdominal pain, anal bleeding, blood in stool, constipation, diarrhea, nausea, rectal pain and vomiting.  Endocrine: Negative for cold intolerance, heat intolerance, polydipsia, polyphagia and polyuria.  Genitourinary: Negative for decreased urine volume, difficulty urinating, dyspareunia, dysuria, enuresis, flank pain, frequency, genital sores,  hematuria, menstrual problem, pelvic pain, urgency, vaginal bleeding, vaginal discharge and vaginal pain.       Nocturia x 1-2.  No urinary leakage.  Musculoskeletal: Positive for arthralgias and myalgias. Negative for back pain, gait problem, joint swelling, neck pain and neck stiffness.  Skin: Negative for color change, pallor, rash and wound.  Allergic/Immunologic: Negative for environmental allergies, food allergies and immunocompromised state.  Neurological: Negative for dizziness, tremors, seizures, syncope, facial asymmetry, speech difficulty, weakness, light-headedness, numbness and headaches.  Hematological: Negative for adenopathy. Does not bruise/bleed easily.  Psychiatric/Behavioral: Positive for dysphoric mood. Negative for agitation, behavioral problems, confusion, decreased concentration, hallucinations, self-injury, sleep disturbance and suicidal ideas. The patient is not nervous/anxious and is not hyperactive.        Bedtime 1100; wakes up 700.    Past Medical History:  Diagnosis Date  . ASCUS with positive high risk HPV 05/25/2011  . Chlamydia 09/23/2010  . Hypertension 09/23/2005  . Symptomatic mammary hypertrophy   . 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 Colonoscopy  06/23/2012   normal; repeat in 10 years; [facility].  Marland Kitchen. TUBAL LIGATION     No Known Allergies No current outpatient medications on file prior to visit.   No current facility-administered medications on file prior to visit.    Social History   Socioeconomic History  . Marital status: Single    Spouse name: Not on file  . Number of children: Not on file  . Years of education: Not on file  . Highest education level: Not on file  Social Needs  . Financial resource strain: Not on file  . Food insecurity - worry: Not on file  . Food insecurity -  inability: Not on file  . Transportation needs - medical: Not on file  .  Transportation needs - non-medical: Not on file  Occupational History  . Not on file  Tobacco Use  . Smoking status: Never Smoker  . Smokeless tobacco: Never Used  Substance and Sexual Activity  . Alcohol use: No    Alcohol/week: 0.0 oz  . Drug use: No  . Sexual activity: Yes    Partners: Male    Birth control/protection: Surgical  Other Topics Concern  . 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      Tobacco: none       Alcohol: none      Drugs: none      Exercise:  None in 2018      Sexually activity: yes; history of Chlamydia 2012.        Seatbelt: 100% of time.      Guns: none   Family History  Problem Relation Age of Onset  . Colon cancer Mother [id number]       colon cancer  . Cancer Mother        Breast cancer age 52; colon cancer age 52; bladder cancer age 52.  Marland Kitchen. Hypertension Mother   . Hyperlipidemia Mother   . Breast cancer Mother [id number]       Breast cancer  . Cancer Maternal Grandmother        COLON  . Heart disease Maternal Grandfather   . Cancer Father 4560       colon cancer.  . Diabetes Daughter        Objective:    BP 138/88   Pulse (!) 105   Temp 98 F (36.7 C) (Oral)   Resp 16   Ht 5' 6.14" (1.68 m)   Wt 226 lb (102.5 kg)   LMP 04/06/2017 (Approximate)   SpO2 96%   BMI 36.32 kg/m  Physical Exam  Constitutional: She is oriented to person, place, and time. She appears well-developed and well-nourished. No distress.  HENT:  Head: Normocephalic and atraumatic.  Right Ear: External ear normal.  Left Ear: External ear normal.  Nose: Nose normal.  Mouth/Throat: Oropharynx is clear and moist.  Eyes: Conjunctivae and EOM are normal. Pupils are equal, round, and reactive to light.  Neck: Normal range of motion and  full passive range of motion without pain. Neck supple. No JVD present. Carotid bruit is not present. No thyromegaly present.  Cardiovascular: Normal rate, regular rhythm and normal heart sounds. Exam reveals no gallop and no friction rub.  No murmur heard. Pulmonary/Chest: Effort normal and breath sounds normal. She has no wheezes. She has no rales. Right breast exhibits no inverted nipple, no mass, no nipple discharge, no skin change and no tenderness. Left breast exhibits no inverted nipple, no mass, no nipple discharge, no skin change and no tenderness. Breasts are symmetrical.  Abdominal: Soft. Bowel sounds are normal. She exhibits no distension and no mass. There is no tenderness. There is no rebound and no guarding.  Musculoskeletal:       Right shoulder: Normal.       Left shoulder: Normal.       Cervical back: Normal.  Lymphadenopathy:  She has no cervical adenopathy.  Neurological: She is alert and oriented to person, place, and time. She has normal reflexes. No cranial nerve deficit. She exhibits normal muscle tone. Coordination normal.  Skin: Skin is warm and dry. No rash noted. She is not diaphoretic. No erythema. No pallor.  Psychiatric: She has a normal mood and affect. Her behavior is normal. Judgment and thought content normal.  Nursing note and vitals reviewed.  No results found. Depression screen [facility] 2/9 08/25/2017 06/24/2017 07/20/2016 07/14/2015 03/31/2015  Decreased Interest 0 0 0 0 0  Down, Depressed, Hopeless - 0 0 1 0  PHQ - 2 Score 0 0 0 1 0   Fall Risk  08/25/2017 06/24/2017  Falls in the past year? No No        Assessment & Plan:   1. Routine physical examination   2. Essential hypertension, benign   3. VAIN II (vaginal intraepithelial neoplasia grade II)   4. Family history of breast cancer in first degree relative   5. Family history of colon cancer   6. Screening for diabetes mellitus   7. Screening, lipid   8. Colon cancer screening   9. Large breasts      -anticipatory guidance provided --- exercise, weight loss, safe driving practices, aspirin 81mg  daily. -obtain age appropriate screening labs and labs for chronic disease management. -refer to GI for colonoscopy due to family history of colon cancer. -refer to plastic surgery due to large breasts. -hypertension controlled; obtain labs; refills provided.    Orders Placed This Encounter  Procedures  . CBC with Differential/Platelet  . Comprehensive metabolic panel    Order Specific Question:   Has the patient fasted?    Answer:   No  . Hemoglobin A1c  . Lipid panel    Order Specific Question:   Has the patient fasted?    Answer:   No  . TSH  . Ambulatory referral to Gastroenterology    Referral Priority:   Routine    Referral Type:   Consultation    Referral Reason:   Specialty Services Required    Number of Visits Requested:   1  . Ambulatory referral to Plastic Surgery    Referral Priority:   Routine    Referral Type:   Surgical    Referral Reason:   Specialty Services Required    Requested Specialty:   Plastic Surgery    Number of Visits Requested:   1  . POCT urinalysis dipstick  . EKG 12-Lead   Meds ordered this encounter  Medications  . lisinopril (PRINIVIL,ZESTRIL) 10 MG tablet    Sig: Take 1 tablet (10 mg total) by mouth daily.    Dispense:  30 tablet    Refill:  0    Return in about 6 months (around 02/23/2018) for follow-up chronic medical conditions.   Meilin Brosh Paulita Fujita, M.D. Primary Care at [facility] [address] [place], [place]  [address] [phone] phone [phone] fax

## 2017-08-25 NOTE — Patient Instructions (Addendum)
IF you received an x-ray today, you will receive an invoice from [facility]. Please contact [facility] at [phone] with questions or concerns regarding your invoice.   IF you received labwork today, you will receive an invoice from [facility]. Please contact [facility] at [phone] with questions or concerns regarding your invoice.   Our billing staff will not be able to assist you with questions regarding bills from these companies.  You will be contacted with the lab results as soon as they are available. The fastest way to get your results is to activate your My Chart account. Instructions are located on the last page of this paperwork. If you have not heard from [place] regarding the results in 2 weeks, please contact this office.      Preventive Care 40-64 Years, Female Preventive care refers to lifestyle choices and visits with your health care provider that can promote health and wellness. What does preventive care include?  A yearly physical exam. This is also called an annual well check.  Dental exams once or twice a year.  Routine eye exams. Ask your health care provider how often you should have your eyes checked.  Personal lifestyle choices, including: ? Daily care of your teeth and gums. ? Regular physical activity. ? Eating a healthy diet. ? Avoiding tobacco and drug use. ? Limiting alcohol use. ? Practicing safe sex. ? Taking low-dose aspirin daily starting at age 34. ? Taking vitamin and mineral supplements as recommended by your health care provider. What happens during an annual well check? The services and screenings done by your health care provider during your annual well check will depend on your age, overall health, lifestyle risk factors, and family history of disease. Counseling Your health care provider may ask you questions about your:  Alcohol use.  Tobacco use.  Drug use.  Emotional well-being.  Home and relationship  well-being.  Sexual activity.  Eating habits.  Work and work Statistician.  Method of birth control.  Menstrual cycle.  Pregnancy history.  Screening You may have the following tests or measurements:  Height, weight, and BMI.  Blood pressure.  Lipid and cholesterol levels. These may be checked every 5 years, or more frequently if you are over 65 years old.  Skin check.  Lung cancer screening. You may have this screening every year starting at age 102 if you have a 30-pack-year history of smoking and currently smoke or have quit within the past 15 years.  Fecal occult blood test (FOBT) of the stool. You may have this test every year starting at age 33.  Flexible sigmoidoscopy or colonoscopy. You may have a sigmoidoscopy every 5 years or a colonoscopy every 10 years starting at age 23.  Hepatitis C blood test.  Hepatitis B blood test.  Sexually transmitted disease (STD) testing.  Diabetes screening. This is done by checking your blood sugar (glucose) after you have not eaten for a while (fasting). You may have this done every 1-3 years.  Mammogram. This may be done every 1-2 years. Talk to your health care provider about when you should start having regular mammograms. This may depend on whether you have a family history of breast cancer.  BRCA-related cancer screening. This may be done if you have a family history of breast, ovarian, tubal, or peritoneal cancers.  Pelvic exam and Pap test. This may be done every 3 years starting at age 3. Starting at age 55, this may be done every 5 years if  you have a Pap test in combination with an HPV test.  Bone density scan. This is done to screen for osteoporosis. You may have this scan if you are at high risk for osteoporosis.  Discuss your test results, treatment options, and if necessary, the need for more tests with your health care provider. Vaccines Your health care provider may recommend certain vaccines, such  as:  Influenza vaccine. This is recommended every year.  Tetanus, diphtheria, and acellular pertussis (Tdap, Td) vaccine. You may need a Td booster every 10 years.  Varicella vaccine. You may need this if you have not been vaccinated.  Zoster vaccine. You may need this after age 70.  Measles, mumps, and rubella (MMR) vaccine. You may need at least one dose of MMR if you were born in 1957 or later. You may also need a second dose.  Pneumococcal 13-valent conjugate (PCV13) vaccine. You may need this if you have certain conditions and were not previously vaccinated.  Pneumococcal polysaccharide (PPSV23) vaccine. You may need one or two doses if you smoke cigarettes or if you have certain conditions.  Meningococcal vaccine. You may need this if you have certain conditions.  Hepatitis A vaccine. You may need this if you have certain conditions or if you travel or work in places where you may be exposed to hepatitis A.  Hepatitis B vaccine. You may need this if you have certain conditions or if you travel or work in places where you may be exposed to hepatitis B.  Haemophilus influenzae type b (Hib) vaccine. You may need this if you have certain conditions.  Talk to your health care provider about which screenings and vaccines you need and how often you need them. This information is not intended to replace advice given to you by your health care provider. Make sure you discuss any questions you have with your health care provider. Document Released: 10/06/2015 Document Revised: 05/29/2016 Document Reviewed: 07/11/2015 Elsevier Interactive Patient Education  2017 [employer].

## 2017-08-26 ENCOUNTER — Ambulatory Visit: Payer: Self-pay

## 2017-08-26 LAB — COMPREHENSIVE METABOLIC PANEL
A/G RATIO: 1.4 (ref 1.2–2.2)
ALBUMIN: 4.2 g/dL (ref 3.5–5.5)
ALT: 9 IU/L (ref 0–32)
AST: 18 IU/L (ref 0–40)
Alkaline Phosphatase: 82 IU/L (ref 39–117)
BILIRUBIN TOTAL: 0.3 mg/dL (ref 0.0–1.2)
BUN / CREAT RATIO: 13 (ref 9–23)
BUN: 12 mg/dL (ref 6–24)
CALCIUM: 9.7 mg/dL (ref 8.7–10.2)
CHLORIDE: 103 mmol/L (ref 96–106)
CO2: 24 mmol/L (ref 20–29)
Creatinine, Ser: 0.89 mg/dL (ref 0.57–1.00)
GFR, EST AFRICAN AMERICAN: 86 mL/min/{1.73_m2} (ref >59)
GFR, EST NON AFRICAN AMERICAN: 75 mL/min/{1.73_m2} (ref >59)
Globulin, Total: 3 g/dL (ref 1.5–4.5)
Glucose: 78 mg/dL (ref 65–99)
POTASSIUM: 4 mmol/L (ref 3.5–5.2)
Sodium: 141 mmol/L (ref 134–144)
TOTAL PROTEIN: 7.2 g/dL (ref 6.0–8.5)

## 2017-08-26 LAB — HEMOGLOBIN A1C
ESTIMATED AVERAGE GLUCOSE: 97 mg/dL
HEMOGLOBIN A1C: 5 % (ref 4.8–5.6)

## 2017-08-26 LAB — CBC WITH DIFFERENTIAL/PLATELET
BASOS: 0 %
Basophils Absolute: 0 10*3/uL (ref 0.0–0.2)
EOS (ABSOLUTE): 0.3 10*3/uL (ref 0.0–0.4)
EOS: 4 %
HEMATOCRIT: 34.7 % (ref 34.0–46.6)
HEMOGLOBIN: 12.1 g/dL (ref 11.1–15.9)
IMMATURE GRANS (ABS): 0 10*3/uL (ref 0.0–0.1)
Immature Granulocytes: 0 %
LYMPHS: 29 %
Lymphocytes Absolute: 2.2 10*3/uL (ref 0.7–3.1)
MCH: 30.6 pg (ref 26.6–33.0)
MCHC: 34.9 g/dL (ref 31.5–35.7)
MCV: 88 fL (ref 79–97)
MONOCYTES: 5 %
Monocytes Absolute: 0.4 10*3/uL (ref 0.1–0.9)
NEUTROS ABS: 4.7 10*3/uL (ref 1.4–7.0)
Neutrophils: 62 %
Platelets: 299 10*3/uL (ref 150–379)
RBC: 3.95 x10E6/uL (ref 3.77–5.28)
RDW: 14.5 % (ref 12.3–15.4)
WBC: 7.6 10*3/uL (ref 3.4–10.8)

## 2017-08-26 LAB — TSH: TSH: 1.18 u[IU]/mL (ref 0.450–4.500)

## 2017-08-26 LAB — LIPID PANEL
CHOLESTEROL TOTAL: 222 mg/dL — AB (ref 100–199)
Chol/HDL Ratio: 3.8 ratio (ref 0.0–4.4)
HDL: 58 mg/dL (ref >39)
LDL Calculated: 141 mg/dL — ABNORMAL HIGH (ref 0–99)
Triglycerides: 115 mg/dL (ref 0–149)
VLDL CHOLESTEROL CAL: 23 mg/dL (ref 5–40)

## 2017-08-26 NOTE — Telephone Encounter (Signed)
Phone call from pt.  Reported she rec'd a flu shot yesterday in left arm.  Stated this morning she noticed her arm has gotten red, warm, swollen and slightly firm.  Stated she thinks her symptoms are mild.   Reported has some itching at the site.  Reassured pt. that a common side effect can be redness, warmth and mild swelling for a couple days.  Advised to take Ibuprofen, per bottle directions, and can even apply ice to the site.  Advised to continue to monitor site, and call back if she has increased redness and swelling.  Pt. Verb. Understanding.  Agrees with plan .           Reason for Disposition . Caller has medication question only, adult not sick, and triager answers question  Answer Assessment - Initial Assessment Questions 1. SYMPTOMS: "Do you have any symptoms?"     Redness, warmth, firmness at site, started this AM  2. SEVERITY: If symptoms are present, ask "Are they mild, moderate or severe?"     Mild  Protocols used: MEDICATION QUESTION CALL-A-AH

## 2017-09-09 ENCOUNTER — Telehealth: Payer: Self-pay | Admitting: Internal Medicine

## 2017-09-09 NOTE — Telephone Encounter (Signed)
See comments from Dr. Marina GoodellPerry, pt is due for colonoscopy.

## 2017-09-09 NOTE — Telephone Encounter (Signed)
Based on what you told me she would be due

## 2017-09-09 NOTE — Telephone Encounter (Signed)
Patient states she was told by pcp that due to fam hx of colon cancer she should have colonoscopy every 5 years. Last colon with Dr.Patterson 2012 and recall not till 2022. Pt states both parents passed due to colon cancer: Father age 460 and mother age 52 and grandmother age 52. Pt wanting to know if she should have recall sooner. DOD for today 12.18.18 Dr.Perry.

## 2017-09-09 NOTE — Telephone Encounter (Signed)
As DOD Dr. Marina GoodellPerry please see note below and advise if pt needs sooner recall due to family history of colon cancer.

## 2017-09-19 ENCOUNTER — Other Ambulatory Visit: Payer: Self-pay | Admitting: *Deleted

## 2017-09-19 ENCOUNTER — Telehealth: Payer: Self-pay | Admitting: Family Medicine

## 2017-09-19 MED ORDER — LISINOPRIL 10 MG PO TABS: 10.0000 mg | ORAL_TABLET | Freq: Every day | ORAL | 0 refills | Status: DC

## 2017-09-19 NOTE — Telephone Encounter (Signed)
[facility] for [facility] and EKG tomorrow. Pt stated she was told by Dr. Katrinka BlazingSmith to come in but [facility] did not see orders for these. They wanted to know if they should cancel pt's appt until orders are put in. I spoke with Rose and she said pt should schedule with Dr. Katrinka BlazingSmith and we were unsure if they could do xray/EKG tomorrow without orders. I let [facility] know it would be better for pt to schedule with Dr. Katrinka BlazingSmith but if she was only able to do tomorrow we could still see pt we just don't know if orders would be in. Please advise.

## 2017-09-19 NOTE — Telephone Encounter (Signed)
Copied from CRM [id number]. Topic: General - Other >> Sep 19, 2017  9:53 AM Cecelia ByarsGreen, Yazlyn Wentzel L, RMA wrote: Reason for CRM: Medication refill request for Lisinopril 10 mg [facility] [place] [facility]

## 2017-09-20 ENCOUNTER — Ambulatory Visit: Payer: 59 | Admitting: Physician Assistant

## 2017-09-23 HISTORY — PX: REDUCTION MAMMAPLASTY: SUR839

## 2017-09-26 NOTE — Telephone Encounter (Signed)
Left message for patient to call back and schedule recall with Dr.Perry. Referral in system as well.

## 2017-10-02 ENCOUNTER — Telehealth: Payer: Self-pay | Admitting: Family Medicine

## 2017-10-02 NOTE — Telephone Encounter (Signed)
Sent pt to [facility] because [facility] stated that pt insurance was out of network.

## 2017-10-07 ENCOUNTER — Encounter: Payer: Self-pay | Admitting: Internal Medicine

## 2017-10-20 ENCOUNTER — Other Ambulatory Visit: Payer: Self-pay

## 2017-10-20 ENCOUNTER — Telehealth: Payer: Self-pay

## 2017-10-20 MED ORDER — LISINOPRIL 10 MG PO TABS: 10.0000 mg | ORAL_TABLET | Freq: Every day | ORAL | 0 refills | Status: DC

## 2017-10-20 NOTE — Telephone Encounter (Signed)
Received message from after-hours service ([facility] [employer]) re pt needs refill of Lisinopril.  Refilled Rx, notified patient.

## 2017-10-22 MED ORDER — LISINOPRIL 10 MG PO TABS: 10.0000 mg | ORAL_TABLET | Freq: Every day | ORAL | 1 refills | Status: DC

## 2017-10-22 NOTE — Addendum Note (Signed)
Addended by: Ethelda ChickSMITH, Jamine Highfill M on: 10/22/2017 09:22 AM   Modules accepted: Orders

## 2017-10-22 NOTE — Progress Notes (Signed)
Appointment 10/25/17 with PCP/Everado Pillsbury.  Last OV 08/2017. Only one month refill approved yet progress note from 08/2017 recommended follow-up in six months; six month of medication sent in per office protocol.

## 2017-10-24 ENCOUNTER — Encounter: Payer: Self-pay | Admitting: Family Medicine

## 2017-10-25 ENCOUNTER — Ambulatory Visit (INDEPENDENT_AMBULATORY_CARE_PROVIDER_SITE_OTHER): Payer: Managed Care, Other (non HMO)

## 2017-10-25 ENCOUNTER — Ambulatory Visit: Payer: Managed Care, Other (non HMO) | Admitting: Family Medicine

## 2017-10-25 ENCOUNTER — Encounter: Payer: Self-pay | Admitting: Family Medicine

## 2017-10-25 VITALS — BP 118/90 | HR 94 | Temp 97.6°F | Resp 16 | Ht 65.5 in | Wt 227.0 lb

## 2017-10-25 DIAGNOSIS — M25511 Pain in right shoulder: Secondary | ICD-10-CM

## 2017-10-25 DIAGNOSIS — I10 Essential (primary) hypertension: Secondary | ICD-10-CM | POA: Diagnosis not present

## 2017-10-25 DIAGNOSIS — M5030 Other cervical disc degeneration, unspecified cervical region: Secondary | ICD-10-CM

## 2017-10-25 DIAGNOSIS — M25461 Effusion, right knee: Secondary | ICD-10-CM | POA: Diagnosis not present

## 2017-10-25 DIAGNOSIS — S83421A Sprain of lateral collateral ligament of right knee, initial encounter: Secondary | ICD-10-CM

## 2017-10-25 MED ORDER — MELOXICAM 15 MG PO TABS: 15.0000 mg | ORAL_TABLET | Freq: Every day | ORAL | 1 refills | Status: DC

## 2017-10-25 MED ORDER — METHOCARBAMOL 500 MG PO TABS: 500.0000 mg | ORAL_TABLET | Freq: Three times a day (TID) | ORAL | 0 refills | Status: DC | PRN

## 2017-10-25 NOTE — Patient Instructions (Addendum)
IF you received an x-ray today, you will receive an invoice from [facility]. Please contact [facility] at [phone] with questions or concerns regarding your invoice.   IF you received labwork today, you will receive an invoice from [facility]. Please contact [facility] at [phone] with questions or concerns regarding your invoice.   Our billing staff will not be able to assist you with questions regarding bills from these companies.  You will be contacted with the lab results as soon as they are available. The fastest way to get your results is to activate your My Chart account. Instructions are located on the last page of this paperwork. If you have not heard from [place] regarding the results in 2 weeks, please contact this office.    Knee Exercises Ask your health care provider which exercises are safe for you. Do exercises exactly as told by your health care provider and adjust them as directed. It is normal to feel mild stretching, pulling, tightness, or discomfort as you do these exercises, but you should stop right away if you feel sudden pain or your pain gets worse.Do not begin these exercises until told by your health care provider. STRETCHING AND RANGE OF MOTION EXERCISES These exercises warm up your muscles and joints and improve the movement and flexibility of your knee. These exercises also help to relieve pain, numbness, and tingling. Exercise A: Knee Extension, Prone 1. Lie on your abdomen on a bed. 2. Place your left / right knee just beyond the edge of the surface so your knee is not on the bed. You can put a towel under your left / right thigh just above your knee for comfort. 3. Relax your leg muscles and allow gravity to straighten your knee. You should feel a stretch behind your left / right knee. 4. Hold this position for __________ seconds. 5. Scoot up so your knee is supported between repetitions. Repeat __________ times. Complete this stretch  __________ times a day. Exercise B: Knee Flexion, Active  1. Lie on your back with both knees straight. If this causes back discomfort, bend your left / right knee so your foot is flat on the floor. 2. Slowly slide your left / right heel back toward your buttocks until you feel a gentle stretch in the front of your knee or thigh. 3. Hold this position for __________ seconds. 4. Slowly slide your left / right heel back to the starting position. Repeat __________ times. Complete this exercise __________ times a day. Exercise C: Quadriceps, Prone  1. Lie on your abdomen on a firm surface, such as a bed or padded floor. 2. Bend your left / right knee and hold your ankle. If you cannot reach your ankle or pant leg, loop a belt around your foot and grab the belt instead. 3. Gently pull your heel toward your buttocks. Your knee should not slide out to the side. You should feel a stretch in the front of your thigh and knee. 4. Hold this position for __________ seconds. Repeat __________ times. Complete this stretch __________ times a day. Exercise D: Hamstring, Supine 1. Lie on your back. 2. Loop a belt or towel over the ball of your left / right foot. The ball of your foot is on the walking surface, right under your toes. 3. Straighten your left / right knee and slowly pull on the belt to raise your leg until you feel a gentle stretch behind your knee. ? Do not let your left /  right knee bend while you do this. ? Keep your other leg flat on the floor. 4. Hold this position for __________ seconds. Repeat __________ times. Complete this stretch __________ times a day. STRENGTHENING EXERCISES These exercises build strength and endurance in your knee. Endurance is the ability to use your muscles for a long time, even after they get tired. Exercise E: Quadriceps, Isometric  1. Lie on your back with your left / right leg extended and your other knee bent. Put a rolled towel or small pillow under your  knee if told by your health care provider. 2. Slowly tense the muscles in the front of your left / right thigh. You should see your kneecap slide up toward your hip or see increased dimpling just above the knee. This motion will push the back of the knee toward the floor. 3. For __________ seconds, keep the muscle as tight as you can without increasing your pain. 4. Relax the muscles slowly and completely. Repeat __________ times. Complete this exercise __________ times a day. Exercise F: Straight Leg Raises - Quadriceps 1. Lie on your back with your left / right leg extended and your other knee bent. 2. Tense the muscles in the front of your left / right thigh. You should see your kneecap slide up or see increased dimpling just above the knee. Your thigh may even shake a bit. 3. Keep these muscles tight as you raise your leg 4-6 inches (10-15 cm) off the floor. Do not let your knee bend. 4. Hold this position for __________ seconds. 5. Keep these muscles tense as you lower your leg. 6. Relax your muscles slowly and completely after each repetition. Repeat __________ times. Complete this exercise __________ times a day. Exercise G: Hamstring, Isometric 1. Lie on your back on a firm surface. 2. Bend your left / right knee approximately __________ degrees. 3. Dig your left / right heel into the surface as if you are trying to pull it toward your buttocks. Tighten the muscles in the back of your thighs to dig as hard as you can without increasing any pain. 4. Hold this position for __________ seconds. 5. Release the tension gradually and allow your muscles to relax completely for __________ seconds after each repetition. Repeat __________ times. Complete this exercise __________ times a day. Exercise H: Hamstring Curls  If told by your health care provider, do this exercise while wearing ankle weights. Begin with __________ weights. Then increase the weight by 1 lb (0.5 kg) increments. Do not wear  ankle weights that are more than __________. 1. Lie on your abdomen with your legs straight. 2. Bend your left / right knee as far as you can without feeling pain. Keep your hips flat against the floor. 3. Hold this position for __________ seconds. 4. Slowly lower your leg to the starting position.  Repeat __________ times. Complete this exercise __________ times a day. Exercise I: Squats (Quadriceps) 1. Stand in front of a table, with your feet and knees pointing straight ahead. You may rest your hands on the table for balance but not for support. 2. Slowly bend your knees and lower your hips like you are going to sit in a chair. ? Keep your weight over your heels, not over your toes. ? Keep your lower legs upright so they are parallel with the table legs. ? Do not let your hips go lower than your knees. ? Do not bend lower than told by your health care provider. ? If your  knee pain increases, do not bend as low. 3. Hold the squat position for __________ seconds. 4. Slowly push with your legs to return to standing. Do not use your hands to pull yourself to standing. Repeat __________ times. Complete this exercise __________ times a day. Exercise J: Wall Slides (Quadriceps)  1. Lean your back against a smooth wall or door while you walk your feet out 18-24 inches (46-61 cm) from it. 2. Place your feet hip-width apart. 3. Slowly slide down the wall or door until your knees bend __________ degrees. Keep your knees over your heels, not over your toes. Keep your knees in line with your hips. 4. Hold for __________ seconds. Repeat __________ times. Complete this exercise __________ times a day. Exercise K: Straight Leg Raises - Hip Abductors 1. Lie on your side with your left / right leg in the top position. Lie so your head, shoulder, knee, and hip line up. You may bend your bottom knee to help you keep your balance. 2. Roll your hips slightly forward so your hips are stacked directly over  each other and your left / right knee is facing forward. 3. Leading with your heel, lift your top leg 4-6 inches (10-15 cm). You should feel the muscles in your outer hip lifting. ? Do not let your foot drift forward. ? Do not let your knee roll toward the ceiling. 4. Hold this position for __________ seconds. 5. Slowly return your leg to the starting position. 6. Let your muscles relax completely after each repetition. Repeat __________ times. Complete this exercise __________ times a day. Exercise L: Straight Leg Raises - Hip Extensors 1. Lie on your abdomen on a firm surface. You can put a pillow under your hips if that is more comfortable. 2. Tense the muscles in your buttocks and lift your left / right leg about 4-6 inches (10-15 cm). Keep your knee straight as you lift your leg. 3. Hold this position for __________ seconds. 4. Slowly lower your leg to the starting position. 5. Let your leg relax completely after each repetition. Repeat __________ times. Complete this exercise __________ times a day. This information is not intended to replace advice given to you by your health care provider. Make sure you discuss any questions you have with your health care provider. Document Released: 07/24/2005 Document Revised: 06/03/2016 Document Reviewed: 07/16/2015 Elsevier Interactive Patient Education  2018 [employer].   Scapular Winging Rehab Ask your health care provider which exercises are safe for you. Do exercises exactly as told by your health care provider and adjust them as directed. It is normal to feel mild stretching, pulling, tightness, or discomfort as you do these exercises, but you should stop right away if you feel sudden pain or your pain gets worse.Do not begin these exercises until told by your health care provider. Stretching and range of motion exercises These exercises warm up your muscles and joints and improve the movement and flexibility of your shoulder. These  exercises also help to relieve pain, numbness, and tingling. Exercise A: Pendulum  1. Stand near a wall or a surface that you can hold onto for balance. 2. Bend at the waist and let your left / right arm hang straight down. Use your other arm to support you. 3. Relax your arm and shoulder muscles, and move your hips and your trunk so your left / right arm swings freely. Your arm should swing because of the motion of your body, not because you are using your  arm or shoulder muscles. 4. Keep moving so your arm swings in the following directions, as told by your health care provider: ? Side to side. ? Forward and backward. ? In clockwise and counterclockwise circles. 5. Slowly return to the starting position. Repeat __________ times. Complete this exercise __________ times a day. Exercise B: Flexion, seated  1. Sit in a stable chair so your left / right forearm can rest on a flat surface. Your elbow should rest at a height that keeps your upper arm next to your body. 2. Keeping your shoulder relaxed, lean forward at the waist and let your hand slide forward. Stop when you feel a stretch in your shoulder, or when you reach the angle that is recommended by your health care provider. 3. Hold for __________ seconds. 4. Slowly return to the starting position. Repeat __________ times. Complete this exercise __________ times a day. Exercise C: Flexion, standing  1. Stand and hold a broomstick, a cane, or a similar object with your hands a little more than shoulder-width apart on the object. Your left / right hand should be palm-up, and your other hand should be palm-down. 2. Use both hands to raise the stick in front of your body and then over your head. Stop when you feel a stretch in your shoulder, or when you reach the angle that is recommended by your health care provider. ? Keep your left / right elbow straight and keep your shoulder muscles relaxed. ? Avoid shrugging your shoulder while you raise  your arm. Keep your shoulder blade tucked down toward the middle of your spine. 3. Hold for __________ seconds. 4. Slowly return to the starting position. Repeat __________ times. Complete this exercise __________ times a day. Exercise D: Abduction, supine  1. Lie on your back and hold a broomstick, a cane, or a similar object. Place your hands a little more than shoulder-width apart on the object. Your left / right hand should be palm-up, and your other hand should be palm-down. 2. Push the stick to raise your left / right arm out to your side and then over your head. Use your other hand to help move the stick. Stop when you feel a stretch in your shoulder, or when you reach the angle that is recommended by your health care provider. ? Avoid shrugging your shoulder while you raise your arm. Keep your shoulder blade tucked down toward the middle of your spine. 3. Hold for __________ seconds. 4. Slowly return to the starting position. Repeat __________ times. Complete this exercise __________ times a day. Exercise E: Flexion, active-assisted  1. Lie on your back. You may bend your knees for comfort. 2. Hold a broomstick, a cane, or a similar object with your hands about shoulder-width apart on the object. Your palms should face toward your feet. 3. Use both hands to raise the stick toward the ceiling. Continue by moving your arms in front of your face, then behind your head, toward the floor. Use your uninjured arm to help your left / right arm move farther. Stop when you feel a gentle stretch in your shoulder, or when you reach the angle where your health care provider tells you to stop. 4. Hold for __________ seconds. 5. Slowly return to the starting position. Repeat __________ times. Complete this exercise __________ times a day. Strengthening exercises These exercises build strength and endurance in your shoulder. Endurance is the ability to use your muscles for a long time, even after they  get tired.  Exercise F: Scapular depression and adduction 1. Sit on a stable chair. Support your arms in front of you with pillows, armrests, or a tabletop. Keep your elbows near the sides of your body. 2. Gently move your shoulder blades down and back toward your spine. Relax the muscles on the tops of your shoulders and in the back of your neck. 3. Hold for __________ seconds. 4. Slowly release the tension, and relax your muscles completely before you repeat the exercise. Repeat __________ times. Complete this exercise __________ times a day. After you have practiced this exercise, try doing it without the arm support. Then, try doing it while standing instead of sitting. Exercise G: Scapular protraction, standing  1. Stand so you are facing a wall, about one arm-length away from the wall. 2. Place your hands on the wall and straighten your elbows. 3. Move your shoulder blades down, toward the middle of your spine. 4. Keep your shoulder blades down and move them forward, toward the wall. You should feel your shoulder blades sliding forward, around your ribcage. ? If you are not sure that you are doing this exercise correctly, ask your health care provider for more instructions. 5. Hold for __________ seconds. 6. Slowly return to the starting position. Let your muscles relax completely before you repeat this exercise. Repeat __________ times. Complete this exercise __________ times a day. Exercise H: Scapular protraction, supine  1. Lie on your back on a firm surface. Hold a __________ weight in your left / right hand. 2. Raise your left / right arm straight into the air so your hand is directly above your shoulder joint. 3. Push the weight into the air so your shoulder blade lifts off of the surface that you are lying on. Do not move your head, neck, or back. 4. Hold for __________ seconds. 5. Slowly return to the starting position. Let your muscles relax completely before you repeat this  exercise. Repeat __________ times. Complete this exercise __________ times a day. Exercise I: Scapular protraction, quadruped  1. Get on your hands and knees. Your hands should be directly below your shoulder blades. 2. Straighten your arms until your elbows are locked. 3. Round your back as much as you can. Think about lifting your rib cage up into your shoulder blades. Keep your neck muscles relaxed. 4. Hold for __________ seconds. 5. Slowly return to the starting position. Let your muscles relax completely before you repeat this exercise. Repeat __________ times. Complete this exercise __________ times a day. Exercise J: Scapular depression  1. Sit in a stable chair that has armrests. Sit upright, with your feet flat on the floor. 2. Put your hands on the armrests with your elbows bent and your fingers pointing forward. Your hands should be about even with the sides of your body. 3. Push down on the armrests to lift yourself off of the chair. Straighten your elbows and lift yourself up as much as you comfortably can. ? Move your shoulder blades down and back. ? Do not let your shoulders move up toward your ears. ? Keep your feet on the ground. As you get stronger, your feet should support less of your body weight as you do this exercise. 4. Hold for __________ seconds. 5. Slowly lower yourself back into the chair. Repeat __________ times. Complete this exercise __________ times a day. Exercise K: Shoulder extension, prone  1. Lie on your abdomen on a firm surface so your left / right arm hangs over the edge. 2.  Hold a __________ weight in your left / right hand so your palm faces in toward your body. Your arm should be straight. 3. Squeeze your shoulder blade down toward the middle of your back. 4. Slowly raise your arm behind you and toward the ceiling, up to the height of the surface that you are lying on. Keep your arm straight. 5. Hold for __________ seconds. 6. Slowly return to  the starting position and relax your muscles. Repeat __________ times. Complete this exercise __________ times a day. Exercise L: Horizontal abduction, prone 1. Lie on your abdomen on a firm surface so your left / right arm hangs over the edge. 2. Hold a __________ weight in your hand so your palm faces toward your feet. Your arm should be straight. 3. Squeeze your shoulder blade down toward the middle of your back. 4. Bend your elbow so your hand moves up, until your elbow is bent to an "L" shape (90 degree angle). With your elbow bent, slowly move your forearm forward and up. Raise your hand up to the height of the surface that you are lying on. ? Your upper arm should not move, and your elbow should stay bent. ? At the top of the movement, your palm should face the floor. 5. Hold for __________ seconds. 6. Slowly return to the starting position and relax your muscles. Repeat __________ times. Complete this exercise __________ times a day. Exercise M: Scapular retraction  1. Sit in a stable chair without armrests, or stand. 2. Secure an exercise band to a stable object in front of you so it is at shoulder height. 3. Hold one end of the exercise band in each hand. Your palms should face down. 4. Straighten your elbows and lift your arms up to shoulder height. 5. Step back, away from the secured end of the exercise band, until the band stretches. 6. Squeeze your shoulder blades together and move your elbows back behind you. Do not shrug your shoulders while you do this. ? Your elbows should stay at about chest or shoulder height. ? Keep your upper arms lifted, away from your sides. 7. Hold for __________ seconds. 8. Slowly return to the starting position. Repeat __________ times. Complete this exercise __________ times a day. Exercise N: Shoulder extension  1. Sit in a stable chair without armrests, or stand. 2. Secure an exercise band to a stable object in front of you so it is at  shoulder height. 3. Hold one end of the exercise band in each hand. Your palms should face each other. 4. Straighten your elbows and lift your hands up to shoulder height. 5. Step back, away from the secured end of the exercise band, until the band stretches. 6. Squeeze your shoulder blades together and pull your hands down to the sides of your thighs. Stop when your hands are straight down by your sides. Do not let your hands go behind your body. 7. Hold for __________ seconds. 8. Slowly return to the starting position. Repeat __________ times. Complete this exercise __________ times a day. Exercise O: Scapular retraction and external rotation  1. Sit in a stable chair without armrests, or stand. 2. Secure an exercise band to a stable object in front of you so it is at shoulder height. 3. Hold one end of the exercise band in each hand. Your palms should face down. 4. Straighten your elbows and lift your hands up to shoulder height. 5. Step back, away from the secured end of the  exercise band, until the band stretches. 6. Bend your elbows and raise your hands up to the height of your head. ? Your palms should face out, in front of you, at the top of the movement. ? Squeeze your shoulder blades together during this movement. 7. Hold for __________ seconds. 8. Slowly straighten your arms to return to the starting position. Repeat __________ times. Complete this exercise __________ times a day. This information is not intended to replace advice given to you by your health care provider. Make sure you discuss any questions you have with your health care provider. Document Released: 09/09/2005 Document Revised: 05/16/2016 Document Reviewed: 08/04/2015 Elsevier Interactive Patient Education  [employer].

## 2017-10-25 NOTE — Progress Notes (Signed)
Subjective:    Patient ID: Dana Reese, female    DOB: 1966/06/25, 52 y.o.   MRN: [id number]  10/25/2017  xray (right shoulder, right knee); Ekg; Knee Pain; and Shoulder Pain    HPI This 52 y.o. female presents for evaluation of R shoulder and R knee.  R shoulder:  Onset two months ago.  No injury. Intermittent during the day; worse at night.  Pain at rest; movement does not worsen.  Radiates into R antecubital region. Irritating feeling.  No numbness; maybe tingling.  No medications.  Tiger balm with relief x 3-4.  Type all day.  R knee pain: onset for months; injury years ago.  Intermittent.  Swelling.  No giving out.  No popping.   Prolonged sitting, stairs, squatting to up.  No medications; no icing; no tiger balm.      HTN: EKG machine not working at [employer]; presenting for EKG.   BP Readings from Last 3 Encounters:  10/25/17 118/90  08/25/17 138/88  06/24/17 (!) 135/101   Wt Readings from Last 3 Encounters:  10/25/17 227 lb (103 kg)  08/25/17 226 lb (102.5 kg)  06/24/17 226 lb 12.8 oz (102.9 kg)   Immunization History  Administered Date(s) Administered  . Influenza,inj,Quad PF,6+ Mos 10/31/2016, 08/25/2017  . Tdap 11/21/2008    Review of Systems  Constitutional: Negative for chills, diaphoresis, fatigue and fever.  Eyes: Negative for visual disturbance.  Respiratory: Negative for cough and shortness of breath.   Cardiovascular: Negative for chest pain, palpitations and leg swelling.  Gastrointestinal: Negative for abdominal pain, constipation, diarrhea, nausea and vomiting.  Endocrine: Negative for cold intolerance, heat intolerance, polydipsia, polyphagia and polyuria.  Musculoskeletal: Positive for arthralgias, gait problem, myalgias, neck pain and neck stiffness. Negative for back pain and joint swelling.  Skin: Negative for rash.  Neurological: Positive for numbness. Negative for dizziness, tremors, seizures, syncope, facial asymmetry, speech difficulty,  weakness, light-headedness and headaches.    Past Medical History:  Diagnosis Date  . ASCUS with positive high risk HPV 05/25/2011  . Chlamydia 09/23/2010  . Hypertension 09/23/2005  . Symptomatic mammary hypertrophy   . 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 Colonoscopy  06/23/2012   normal; repeat in 10 years; [facility].  Marland Kitchen TUBAL LIGATION     No Known Allergies Current Outpatient Medications on File Prior to Visit  Medication Sig Dispense Refill  . lisinopril (PRINIVIL,ZESTRIL) 10 MG tablet Take 1 tablet (10 mg total) by mouth daily. 90 tablet 1   No current facility-administered medications on file prior to visit.    Social History   Socioeconomic History  . Marital status: Single    Spouse name: Not on file  . Number of children: Not on file  . Years of education: Not on file  . Highest education level: Not on file  Social Needs  . Financial resource strain: Not on file  . Food insecurity - worry: Not on file  . Food insecurity - inability: Not on file  . Transportation needs - medical: Not on file  . Transportation needs - non-medical: Not on file  Occupational History  . Not on file  Tobacco Use  . Smoking status: Never Smoker  . Smokeless tobacco: Never Used  Substance and Sexual Activity  . Alcohol use: No    Alcohol/week: 0.0 oz  . Drug use: No  . Sexual activity: Yes    Partners: Male  Birth control/protection: Surgical  Other Topics Concern  . 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      Tobacco: none       Alcohol: none      Drugs: none      Exercise:  None in 2018      Sexually activity: yes; history of Chlamydia 2012.         Seatbelt: 100% of time.      Guns: none   Family History  Problem Relation Age of Onset  . Colon cancer Mother 2464       colon cancer  . Cancer Mother        Breast cancer age 53; colon cancer age 53; bladder cancer age 53.  Marland Kitchen. Hypertension Mother   . Hyperlipidemia Mother   . Breast cancer Mother 6659       Breast cancer  . Cancer Maternal Grandmother        COLON  . Heart disease Maternal Grandfather   . Cancer Father 5460       colon cancer.  . Diabetes Daughter        Objective:    BP 118/90   Pulse 94   Temp 97.6 F (36.4 C) (Oral)   Resp 16   Ht 5' 5.5" (1.664 m)   Wt 227 lb (103 kg)   LMP 04/06/2017 (Approximate)   SpO2 96%   BMI 37.20 kg/m  Physical Exam  Constitutional: She is oriented to person, place, and time. She appears well-developed and well-nourished. No distress.  HENT:  Head: Normocephalic and atraumatic.  Right Ear: External ear normal.  Left Ear: External ear normal.  Nose: Nose normal.  Mouth/Throat: Oropharynx is clear and moist.  Eyes: Conjunctivae and EOM are normal. Pupils are equal, round, and reactive to light.  Neck: Normal range of motion. Neck supple. Carotid bruit is not present.  Cardiovascular: Normal rate, regular rhythm, normal heart sounds and intact distal pulses. Exam reveals no gallop and no friction rub.  No murmur heard. Pulmonary/Chest: Effort normal and breath sounds normal. She has no wheezes. She has no rales.  Musculoskeletal:       Right shoulder: She exhibits pain and spasm. She exhibits normal range of motion, no tenderness, no bony tenderness, normal pulse and normal strength.       Right knee: She exhibits normal range of motion, no swelling, no effusion, no bony tenderness, normal meniscus and no MCL laxity. Tenderness found. Medial joint line tenderness noted. No patellar tendon tenderness noted.       Cervical back: She exhibits pain and spasm. She exhibits normal range of motion, no tenderness and no bony tenderness.        Right lower leg: Normal. She exhibits no tenderness, no bony tenderness and no swelling.  Neurological: She is alert and oriented to person, place, and time. No cranial nerve deficit.  Skin: Skin is warm and dry. No rash noted. She is not diaphoretic. No erythema. No pallor.  Psychiatric: She has a normal mood and affect. Her behavior is normal.   No results found. Depression screen [facility] 2/9 10/25/2017 08/25/2017 06/24/2017 07/20/2016 07/14/2015  Decreased Interest 0 0 0 0 0  Down,  Depressed, Hopeless 0 - 0 0 1  PHQ - 2 Score 0 0 0 0 1   Fall Risk  10/25/2017 08/25/2017 06/24/2017  Falls in the past year? No No No        Assessment & Plan:   1. Essential hypertension   2. Swelling of joint of right knee   3. Acute pain of right shoulder   4. Degenerative disc disease, cervical   5. Sprain of lateral collateral ligament of right knee, initial encounter    Hypertension well controlled; obtain EKG; asymptomatic; no changes to management.  New onset R knee pain/sprain; no injury; consistent with overuse; R knee xray shows small joint effusion; no pathology; recommend rest, icing, stretching with HEP, Mobic daily. If no improvement in one month, refer to ortho.  New onset R shoulder pain/sprain: new onset.  R shoulder xray negative.  Cervical spine films reveal DDD at C4-5 and C5-6.  Consistent with neck sprain.  Treat with Mobic and Robaxin.  Heat.  HEP.  If no improvement in 4 weeks, call for ortho referral.    Orders Placed This Encounter  Procedures  . DG Shoulder Right    Standing Status:   Future    Number of Occurrences:   1    Standing Expiration Date:   10/25/2018    Order Specific Question:   Reason for Exam (SYMPTOM  OR DIAGNOSIS REQUIRED)    Answer:   R shoulder, scapular pain for two months    Order Specific Question:   Is the patient pregnant?    Answer:   No    Order Specific Question:   Preferred imaging location?    Answer:   External  . DG Knee Complete 4 Views  Right    Standing Status:   Future    Number of Occurrences:   1    Standing Expiration Date:   10/25/2018    Order Specific Question:   Reason for Exam (SYMPTOM  OR DIAGNOSIS REQUIRED)    Answer:   R knee swelling and stiffness for years    Order Specific Question:   Is the patient pregnant?    Answer:   No    Order Specific Question:   Preferred imaging location?    Answer:   External  . DG Cervical Spine Complete    Standing Status:   Future    Number of Occurrences:   1    Standing Expiration Date:   10/25/2018    Order Specific Question:   Reason for Exam (SYMPTOM  OR DIAGNOSIS REQUIRED)    Answer:   r scapular and trapezius pain    Order Specific Question:   Is the patient pregnant?    Answer:   No    Order Specific Question:   Preferred imaging location?    Answer:   External  . EKG 12-Lead   Meds ordered this encounter  Medications  . meloxicam (MOBIC) 15 MG tablet    Sig: Take 1 tablet (15 mg total) by mouth daily.    Dispense:  30 tablet    Refill:  1  . methocarbamol (ROBAXIN) 500 MG tablet    Sig: Take 1 tablet (500 mg total) by mouth every 8 (eight) hours as needed for muscle spasms.    Dispense:  60 tablet    Refill:  0    No Follow-up on file.   Hiram Mciver Paulita Fujita, M.D. Primary Care at [facility] previously [facility]  [address] [place],   [address] [phone] phone [phone] fax

## 2017-11-13 ENCOUNTER — Other Ambulatory Visit: Payer: Self-pay | Admitting: Plastic Surgery

## 2017-11-13 DIAGNOSIS — Z1231 Encounter for screening mammogram for malignant neoplasm of breast: Secondary | ICD-10-CM

## 2017-11-16 DIAGNOSIS — M5030 Other cervical disc degeneration, unspecified cervical region: Secondary | ICD-10-CM | POA: Insufficient documentation

## 2017-12-01 ENCOUNTER — Ambulatory Visit (AMBULATORY_SURGERY_CENTER): Payer: Self-pay

## 2017-12-01 ENCOUNTER — Other Ambulatory Visit: Payer: Self-pay

## 2017-12-01 VITALS — Ht 66.0 in | Wt 229.4 lb

## 2017-12-01 DIAGNOSIS — Z800 Family history of malignant neoplasm of digestive organs: Secondary | ICD-10-CM

## 2017-12-01 MED ORDER — PEG-KCL-NACL-NASULF-NA ASC-C 140 G PO SOLR: 1.0000 | Freq: Once | ORAL | 0 refills | Status: AC

## 2017-12-01 NOTE — Progress Notes (Signed)
Denies allergies to eggs or soy products. Denies complication of anesthesia or sedation. Denies use of weight loss medication. Denies use of O2.   Emmi instructions declined.  

## 2017-12-02 ENCOUNTER — Encounter: Payer: Self-pay | Admitting: Internal Medicine

## 2017-12-05 ENCOUNTER — Ambulatory Visit: Payer: Managed Care, Other (non HMO) | Admitting: Family Medicine

## 2017-12-05 ENCOUNTER — Encounter: Payer: Self-pay | Admitting: Family Medicine

## 2017-12-05 ENCOUNTER — Other Ambulatory Visit: Payer: Self-pay

## 2017-12-05 VITALS — BP 158/90 | HR 120 | Temp 98.0°F | Resp 16 | Ht 67.32 in | Wt 227.0 lb

## 2017-12-05 DIAGNOSIS — I10 Essential (primary) hypertension: Secondary | ICD-10-CM | POA: Diagnosis not present

## 2017-12-05 DIAGNOSIS — F4329 Adjustment disorder with other symptoms: Secondary | ICD-10-CM

## 2017-12-05 DIAGNOSIS — Z6835 Body mass index (BMI) 35.0-35.9, adult: Secondary | ICD-10-CM | POA: Diagnosis not present

## 2017-12-05 DIAGNOSIS — F4381 Prolonged grief disorder: Secondary | ICD-10-CM

## 2017-12-05 MED ORDER — HYDROCHLOROTHIAZIDE 25 MG PO TABS
25.0000 mg | ORAL_TABLET | Freq: Every day | ORAL | 1 refills | Status: DC
Start: 2017-12-05 — End: 2018-06-10

## 2017-12-05 NOTE — Patient Instructions (Addendum)
  [email or web address] 20-30 grams of protein per meal.  PREMIER PROTEIN SHAKE for a snack.  IF you received an x-ray today, you will receive an invoice from [facility]. Please contact [facility] at [phone] with questions or concerns regarding your invoice.   IF you received labwork today, you will receive an invoice from [facility]. Please contact [facility] at [phone] with questions or concerns regarding your invoice.   Our billing staff will not be able to assist you with questions regarding bills from these companies.  You will be contacted with the lab results as soon as they are available. The fastest way to get your results is to activate your My Chart account. Instructions are located on the last page of this paperwork. If you have not heard from us regarding the results in 2 weeks, please contact this office.

## 2017-12-05 NOTE — Progress Notes (Signed)
Subjective:    Patient ID: Dana Reese, female    DOB: 1966-03-25, 52 y.o.   MRN: [id number]  12/05/2017  Hypertension (follow-up pt states has been running high )    HPI This 52 y.o. female presents for evaluation of elevated blood pressures.  Admits to stress; ugly cycle; stress about losing weight which causing eating.  Work is good; home is good.  Had lost weight in the past three years; [facility]; got under 200 pounds; special diet with supplements; eating very little.  Baked chicken and vegetables.  May have given appetite suppressant.  Started this week pulling treadmill down.  Started walking.  Has not working on diet.  Outside of self.  168/114.  168/114.  Tired and headache this week.  151/101.  B: nabs, soda regular 20 ounces Lunch: [employer] or [employer] frozen meal Snack: rare Supper:  Baked meat, canned vegetables, soda regular 20 ounces Snack:chips or ice cream sandwich  Management changes made at last visit include the following: Hypertension well controlled; obtain EKG; asymptomatic; no changes to management. New onset R knee pain/sprain; no injury; consistent with overuse; R knee xray shows small joint effusion; no pathology; recommend rest, icing, stretching with HEP, Mobic daily. If no improvement in one month, refer to ortho. New onset R shoulder pain/sprain: new onset.  R shoulder xray negative.  Cervical spine films reveal DDD at C4-5 and C5-6.  Consistent with neck sprain.  Treat with Mobic and Robaxin.  Heat.  HEP.  If no improvement in 4 weeks, call for ortho referral.    Scheduled for breast reduction Jan 29, 2018.    BP Readings from Last 3 Encounters:  12/05/17 (!) 158/90  10/25/17 118/90  08/25/17 138/88   Wt Readings from Last 3 Encounters:  12/05/17 227 lb (103 kg)  12/01/17 229 lb 6.4 oz (104.1 kg)  10/25/17 227 lb (103 kg)   Immunization History  Administered Date(s) Administered  . Influenza,inj,Quad PF,6+ Mos  10/31/2016, 08/25/2017  . Tdap 11/21/2008    Review of Systems  Constitutional: Negative for chills, diaphoresis, fatigue and fever.  Eyes: Negative for visual disturbance.  Respiratory: Negative for cough and shortness of breath.   Cardiovascular: Negative for chest pain, palpitations and leg swelling.  Gastrointestinal: Negative for abdominal pain, constipation, diarrhea, nausea and vomiting.  Endocrine: Negative for cold intolerance, heat intolerance, polydipsia, polyphagia and polyuria.  Musculoskeletal: Negative for arthralgias.  Neurological: Negative for dizziness, tremors, seizures, syncope, facial asymmetry, speech difficulty, weakness, light-headedness, numbness and headaches.  Psychiatric/Behavioral: Positive for dysphoric mood.    Past Medical History:  Diagnosis Date  . Allergy   . ASCUS with positive high risk HPV 05/25/2011  . Chlamydia 09/23/2010  . Hyperlipidemia   . Hypertension 09/23/2005  . Symptomatic mammary hypertrophy   . 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 Colonoscopy  06/23/2012   normal; repeat in 10 years; [facility].  Marland Kitchen COLONOSCOPY    . TUBAL LIGATION     No Known Allergies Current Outpatient Medications on File Prior to Visit  Medication Sig Dispense Refill  . lisinopril (PRINIVIL,ZESTRIL) 10 MG tablet Take 1 tablet (10 mg total) by mouth daily. 90 tablet 1   No current facility-administered medications on file prior to visit.    Social History   Socioeconomic History  . Marital status: Single    Spouse name: Not on file  . Number of children:  Not on file  . Years of education: Not on file  . Highest education level: Not on file  Occupational History  . Not on file  Social Needs  . Financial resource strain: Not on file  . Food insecurity:    Worry: Not on file    Inability: Not on file  . Transportation needs:    Medical: Not on file    Non-medical: Not on  file  Tobacco Use  . Smoking status: Never Smoker  . Smokeless tobacco: Never Used  Substance and Sexual Activity  . Alcohol use: No    Alcohol/week: 0.0 oz  . Drug use: No  . Sexual activity: Yes    Partners: Male    Birth control/protection: Surgical  Lifestyle  . Physical activity:    Days per week: Not on file    Minutes per session: Not on file  . Stress: Not on file  Relationships  . Social connections:    Talks on phone: Not on file    Gets together: Not on file    Attends religious service: Not on file    Active member of club or organization: Not on file    Attends meetings of clubs or organizations: Not on file    Relationship status: Not on file  . Intimate partner violence:    Fear of current or ex partner: Not on file    Emotionally abused: Not on file    Physically abused: Not on file    Forced sexual activity: Not on file  Other Topics Concern  . 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      Tobacco: none       Alcohol: none      Drugs: none      Exercise:  None in 2018      Sexually activity: yes; history of Chlamydia 2012.        Seatbelt: 100% of time.      Guns: none   Family History  Problem Relation Age of Onset  . Colon cancer Mother 50       colon cancer  . Cancer Mother        Breast cancer age 60; colon cancer age 71; bladder cancer age 55.  Marland Kitchen Hypertension Mother   . Hyperlipidemia Mother   . Breast cancer Mother 57       Breast cancer  . Cancer Maternal Grandmother        COLON  . Heart disease Maternal Grandfather   . Cancer Father 39       colon cancer.  . Diabetes Daughter   . Esophageal cancer Neg Hx   . Liver cancer Neg Hx   . Pancreatic cancer Neg Hx   . Rectal cancer Neg Hx   .  Stomach cancer Neg Hx        Objective:    BP (!) 158/90   Pulse (!) 120   Temp 98 F (36.7 C) (Oral)   Resp 16   Ht 5' 7.32" (1.71 m)   Wt 227 lb (103 kg)   LMP 04/06/2017 (Approximate)   SpO2 98%   BMI 35.21 kg/m  Physical Exam  Constitutional: She is oriented  to person, place, and time. She appears well-developed and well-nourished. No distress.  HENT:  Head: Normocephalic and atraumatic.  Right Ear: External ear normal.  Left Ear: External ear normal.  Nose: Nose normal.  Mouth/Throat: Oropharynx is clear and moist.  Eyes: Conjunctivae and EOM are normal. Pupils are equal, round, and reactive to light.  Neck: Normal range of motion. Neck supple. Carotid bruit is not present. No thyromegaly present.  Cardiovascular: Normal rate, regular rhythm, normal heart sounds and intact distal pulses. Exam reveals no gallop and no friction rub.  No murmur heard. Pulmonary/Chest: Effort normal and breath sounds normal. She has no wheezes. She has no rales.  Abdominal: Soft. Bowel sounds are normal. She exhibits no distension and no mass. There is no tenderness. There is no rebound and no guarding.  Lymphadenopathy:    She has no cervical adenopathy.  Neurological: She is alert and oriented to person, place, and time. No cranial nerve deficit.  Skin: Skin is warm and dry. No rash noted. She is not diaphoretic. No erythema. No pallor.  Psychiatric: She has a normal mood and affect. Her behavior is normal.   No results found. Depression screen [facility] 2/9 12/05/2017 10/25/2017 08/25/2017 06/24/2017 07/20/2016  Decreased Interest 0 0 0 0 0  Down, Depressed, Hopeless 0 0 - 0 0  PHQ - 2 Score 0 0 0 0 0   Fall Risk  12/05/2017 10/25/2017 08/25/2017 06/24/2017  Falls in the past year? No No No No        Assessment & Plan:   1. Essential hypertension, benign   2. Prolonged grief reaction   3. Class 2 severe obesity due to excess calories with serious comorbidity and body mass index (BMI) of 35.0  to 35.9 in adult [facility](HCC)     Uncontrolled hypertension: New.  Add hydrochlorothiazide 25 mg 1 tablet daily.  Continue to monitor blood pressure daily at home.  Follow-up in 1 month.  Prolonged grief reaction: Admits to prolonged grief from the loss of family member.  Encourage regular exercise for stress management.  Recommend grief counseling to patient.  Consider depression medication if no improvement.  Patient declines medication at this time.  Obesity: Patient recognizes that she struggles with weight loss due to prolonged grief reaction.  Encouraged my fitness [email or web address].  Encouraged 20-30 g of protein per meal for a total of 80-100 g of protein per day.  Recommend Premier protein shakes to use his snacks and/or breakfast.  Orders Placed This Encounter  Procedures  . Comprehensive metabolic panel  . Urinalysis, dipstick only   Meds ordered this encounter  Medications  . hydrochlorothiazide (HYDRODIURIL) 25 MG tablet    Sig: Take 1 tablet (25 mg total) by mouth daily.    Dispense:  90 tablet    Refill:  1    Return in about 4 weeks (around 01/02/2018) for recheck blood pressure.   Kristi Paulita FujitaMartin Smith, M.D. Primary Care at [facility] [facility] [address] [place]  [id number] [phone] phone [phone] fax

## 2017-12-06 LAB — COMPREHENSIVE METABOLIC PANEL
A/G RATIO: 1.4 (ref 1.2–2.2)
ALBUMIN: 4.3 g/dL (ref 3.5–5.5)
ALT: 9 IU/L (ref 0–32)
AST: 14 IU/L (ref 0–40)
Alkaline Phosphatase: 79 IU/L (ref 39–117)
BUN/Creatinine Ratio: 11 (ref 9–23)
BUN: 10 mg/dL (ref 6–24)
Bilirubin Total: 0.4 mg/dL (ref 0.0–1.2)
CALCIUM: 10 mg/dL (ref 8.7–10.2)
CO2: 25 mmol/L (ref 20–29)
CREATININE: 0.89 mg/dL (ref 0.57–1.00)
Chloride: 105 mmol/L (ref 96–106)
GFR, EST AFRICAN AMERICAN: 86 mL/min/{1.73_m2} (ref >59)
GFR, EST NON AFRICAN AMERICAN: 75 mL/min/{1.73_m2} (ref >59)
Globulin, Total: 3.1 g/dL (ref 1.5–4.5)
Glucose: 87 mg/dL (ref 65–99)
POTASSIUM: 4.2 mmol/L (ref 3.5–5.2)
SODIUM: 144 mmol/L (ref 134–144)
TOTAL PROTEIN: 7.4 g/dL (ref 6.0–8.5)

## 2017-12-06 LAB — URINALYSIS, DIPSTICK ONLY
Bilirubin, UA: NEGATIVE
GLUCOSE, UA: NEGATIVE
Ketones, UA: NEGATIVE
Leukocytes, UA: NEGATIVE
Nitrite, UA: NEGATIVE
PH UA: 6 (ref 5.0–7.5)
PROTEIN UA: NEGATIVE
RBC, UA: NEGATIVE
Specific Gravity, UA: 1.011 (ref 1.005–1.030)
Urobilinogen, Ur: 0.2 mg/dL (ref 0.2–1.0)

## 2017-12-07 ENCOUNTER — Encounter: Payer: Self-pay | Admitting: Family Medicine

## 2017-12-15 ENCOUNTER — Encounter: Payer: 59 | Admitting: Internal Medicine

## 2017-12-19 ENCOUNTER — Encounter: Payer: 59 | Admitting: Internal Medicine

## 2018-01-14 ENCOUNTER — Ambulatory Visit
Admission: RE | Admit: 2018-01-14 | Discharge: 2018-01-14 | Disposition: A | Discharge status: Home / Self Care | Payer: 59 | Source: Ambulatory Visit | Attending: Plastic Surgery | Admitting: Plastic Surgery

## 2018-01-14 DIAGNOSIS — Z1231 Encounter for screening mammogram for malignant neoplasm of breast: Secondary | ICD-10-CM

## 2018-01-29 ENCOUNTER — Other Ambulatory Visit: Payer: Self-pay | Admitting: Plastic Surgery

## 2018-01-29 HISTORY — PX: BREAST REDUCTION SURGERY: SHX8

## 2018-02-16 ENCOUNTER — Encounter: Payer: Self-pay | Admitting: Family Medicine

## 2018-06-10 ENCOUNTER — Other Ambulatory Visit: Payer: Self-pay | Admitting: Family Medicine

## 2018-06-10 NOTE — Telephone Encounter (Signed)
Lisinopril 10 mg refill         Hydrochlorothiazide 25 mg refill   Last refill:03/02/18 #90 Last OV: 12/05/17 PCP: none- [facility]    Not filled: patient does not have follow up scheduled

## 2018-07-20 ENCOUNTER — Other Ambulatory Visit: Payer: Self-pay | Admitting: Family Medicine

## 2018-07-21 NOTE — Telephone Encounter (Signed)
Pharmacy note dated 06/12/18 reads pt needs to schedule appointment with new provider for refills; last CPE 08/25/17 with Dr Nilda Simmer; left message on voicemail [phone]; when pt calls back to schedule appointment; will route refill to office for final disposition.  Requested medication (s) are due for refill today: yes  Requested medication (s) are on the active medication list: yes  Last refill:  06/12/18  Future visit scheduled: no  Notes to clinic:  Attempted to contact pt      Requested Prescriptions  Pending Prescriptions Disp Refills  . lisinopril (PRINIVIL,ZESTRIL) 10 MG tablet [Pharmacy Med Name: LISINOPRIL 10MG   TAB] 30 tablet 0    Sig: TAKE 1 TABLET BY MOUTH ONCE DAILY **PATIENT WILL NEED TO CALL TO SCHEDULE OFFICE VISIT WITH NEW PROVIDER FOR FUTURE REFILLS**     Cardiovascular:  ACE Inhibitors Failed - 07/20/2018 12:50 PM      Failed - Cr in normal range and within 180 days    Creat  Date Value Ref Range Status  04/26/2014 0.89 0.50 - 1.10 mg/dL Final   Creatinine, Ser  Date Value Ref Range Status  12/05/2017 0.89 0.57 - 1.00 mg/dL Final         Failed - K in normal range and within 180 days    Potassium  Date Value Ref Range Status  12/05/2017 4.2 3.5 - 5.2 mmol/L Final         Failed - Last BP in normal range    BP Readings from Last 1 Encounters:  12/05/17 (!) 158/90         Failed - Valid encounter within last 6 months    Recent Outpatient Visits          7 months ago Essential hypertension, benign   Primary Care at [facility], Myrle Sheng, MD   8 months ago Essential hypertension   Primary Care at [facility], Myrle Sheng, MD   11 months ago Routine physical examination   Primary Care at [facility], Myrle Sheng, MD   1 year ago Morbid obesity [facility])   Primary Care at [facility], [place], [place]   2 years ago Annual physical exam   Primary Care at [facility], [place], [place]             Passed - Patient is not pregnant    . hydrochlorothiazide  (HYDRODIURIL) 25 MG tablet [Pharmacy Med Name: HYDROCHLOROT 25MG    TAB] 30 tablet 0    Sig: TAKE 1 TABLET BY MOUTH ONCE DAILY ( PATIENT NEEDS OFFICE VISIT WITH NEW PROVIDER FOR FUTURE REFILLS)     Cardiovascular: Diuretics - Thiazide Failed - 07/20/2018 12:50 PM      Failed - Last BP in normal range    BP Readings from Last 1 Encounters:  12/05/17 (!) 158/90         Failed - Valid encounter within last 6 months    Recent Outpatient Visits          7 months ago Essential hypertension, benign   Primary Care at [facility], Myrle Sheng, MD   8 months ago Essential hypertension   Primary Care at [facility], Myrle Sheng, MD   11 months ago Routine physical examination   Primary Care at [facility], Myrle Sheng, MD   1 year ago Morbid obesity [facility])   Primary Care at [facility], [place], [place]   2 years ago Annual physical exam   Primary Care at [facility], [place], [place]  Passed - Ca in normal range and within 360 days    Calcium  Date Value Ref Range Status  12/05/2017 10.0 8.7 - 10.2 mg/dL Final         Passed - Cr in normal range and within 360 days    Creat  Date Value Ref Range Status  04/26/2014 0.89 0.50 - 1.10 mg/dL Final   Creatinine, Ser  Date Value Ref Range Status  12/05/2017 0.89 0.57 - 1.00 mg/dL Final         Passed - K in normal range and within 360 days    Potassium  Date Value Ref Range Status  12/05/2017 4.2 3.5 - 5.2 mmol/L Final         Passed - Na in normal range and within 360 days    Sodium  Date Value Ref Range Status  12/05/2017 144 134 - 144 mmol/L Final

## 2018-08-14 ENCOUNTER — Ambulatory Visit (INDEPENDENT_AMBULATORY_CARE_PROVIDER_SITE_OTHER): Payer: Managed Care, Other (non HMO) | Admitting: Emergency Medicine

## 2018-08-14 ENCOUNTER — Encounter: Payer: Self-pay | Admitting: Emergency Medicine

## 2018-08-14 ENCOUNTER — Other Ambulatory Visit: Payer: Self-pay

## 2018-08-14 VITALS — BP 163/95 | HR 88 | Temp 98.0°F | Resp 16 | Ht 66.0 in | Wt 229.0 lb

## 2018-08-14 DIAGNOSIS — Z1329 Encounter for screening for other suspected endocrine disorder: Secondary | ICD-10-CM | POA: Diagnosis not present

## 2018-08-14 DIAGNOSIS — Z23 Encounter for immunization: Secondary | ICD-10-CM

## 2018-08-14 DIAGNOSIS — Z13228 Encounter for screening for other metabolic disorders: Secondary | ICD-10-CM

## 2018-08-14 DIAGNOSIS — Z0001 Encounter for general adult medical examination with abnormal findings: Secondary | ICD-10-CM

## 2018-08-14 DIAGNOSIS — Z130 Encounter for screening for diseases of the blood and blood-forming organs and certain disorders involving the immune mechanism: Secondary | ICD-10-CM

## 2018-08-14 DIAGNOSIS — I10 Essential (primary) hypertension: Secondary | ICD-10-CM | POA: Diagnosis not present

## 2018-08-14 MED ORDER — LISINOPRIL-HYDROCHLOROTHIAZIDE 20-12.5 MG PO TABS: 1.0000 | ORAL_TABLET | Freq: Every day | ORAL | 3 refills | Status: DC

## 2018-08-14 NOTE — Progress Notes (Signed)
Dana Reese 52 y.o.   Chief Complaint  Patient presents with  . Annual Exam    need refill on lisinopril and htcz but would like to have one rx with both rx combined    HISTORY OF PRESENT ILLNESS: This is a 52 y.o. female former patient of Dr. Tamala Julian with history of hypertension, ran out of medication 3 weeks ago.  Asymptomatic.  Has no complaints or medical concerns today.Here today for her annual exam.  HPI   Prior to Admission medications   Medication Sig Start Date End Date Taking? Authorizing Provider  hydrochlorothiazide (HYDRODIURIL) 25 MG tablet TAKE 1 TABLET BY MOUTH ONCE DAILY ( PATIENT NEEDS OFFICE VISIT WITH NEW PROVIDER FOR FUTURE REFILLS) 07/21/18  Yes Stallings, Zoe A, MD  lisinopril (PRINIVIL,ZESTRIL) 10 MG tablet TAKE 1 TABLET BY MOUTH ONCE DAILY **PATIENT WILL NEED TO CALL TO SCHEDULE OFFICE VISIT WITH NEW PROVIDER FOR FUTURE REFILLS** 07/21/18  Yes Forrest Moron, MD    No Known Allergies  Patient Active Problem List   Diagnosis Date Noted  . Degenerative disc disease, cervical 11/16/2017  . 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 Vaginal lesion 10/18/2016  . LGSIL on Pap smear of cervix 09/06/2016  . Abnormal Papanicolaou smear of cervix with positive human papilloma virus (HPV) test 10/04/2015  . Perimenopause 10/04/2015  . Macromastia 07/14/2015  . Problems related to high-risk sexual behavior 10/24/2012  . Essential hypertension, benign 10/24/2012  . Family history of colon cancer 10/24/2012  . Family history of breast cancer in first degree relative 10/24/2012    Past Medical History:  Diagnosis Date  . Allergy   . ASCUS with positive high risk HPV 05/25/2011  . Chlamydia 09/23/2010  . Hyperlipidemia   . Hypertension 09/23/2005  . Symptomatic mammary hypertrophy   . VAIN II (vaginal intraepithelial neoplasia grade II) 10/2016    Past Surgical History:  Procedure Laterality Date  . Colonoscopy  06/23/2012   normal;  repeat in 10 years; [facility].  Marland Kitchen COLONOSCOPY    . TUBAL LIGATION      Social History   Socioeconomic History  . Marital status: Single    Spouse name: Not on file  . Number of children: Not on file  . Years of education: Not on file  . Highest education level: Not on file  Occupational History  . Not on file  Social Needs  . Financial resource strain: Not on file  . Food insecurity:    Worry: Not on file    Inability: Not on file  . Transportation needs:    Medical: Not on file    Non-medical: Not on file  Tobacco Use  . Smoking status: Never Smoker  . Smokeless tobacco: Never Used  Substance and Sexual Activity  . Alcohol use: No    Alcohol/week: 0.0 standard drinks  . Drug use: No  . Sexual activity: Yes    Partners: Male    Birth control/protection: Surgical  Lifestyle  . Physical activity:    Days per week: Not on file    Minutes per session: Not on file  . Stress: Not on file  Relationships  . Social connections:    Talks on phone: Not on file    Gets together: Not on file    Attends religious service: Not on file    Active member of club or organization: Not on file    Attends meetings of clubs or organizations: Not on file  Relationship status: Not on file  . Intimate partner violence:    Fear of current or ex partner: Not on file    Emotionally abused: Not on file    Physically abused: Not on file    Forced sexual activity: Not on file  Other Topics Concern  . 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      Tobacco: none       Alcohol: none      Drugs: none      Exercise:  None in 2018      Sexually activity: yes; history of Chlamydia 2012.        Seatbelt: 100% of time.      Guns: none    Family  History  Problem Relation Age of Onset  . Colon cancer Mother 29       colon cancer  . Cancer Mother        Breast cancer age 4; colon cancer age 83; bladder cancer age 17.  Marland Kitchen Hypertension Mother   . Hyperlipidemia Mother   . Breast cancer Mother 35       Breast cancer  . Cancer Maternal Grandmother        COLON  . Heart disease Maternal Grandfather   . Cancer Father 59       colon cancer.  . Diabetes Daughter   . Esophageal cancer Neg Hx   . Liver cancer Neg Hx   . Pancreatic cancer Neg Hx   . Rectal cancer Neg Hx   . Stomach cancer Neg Hx      Review of Systems  Constitutional: Negative.  Negative for chills, fever and weight loss.  HENT: Negative.  Negative for sore throat.   Eyes: Negative.  Negative for blurred vision and double vision.  Respiratory: Negative.  Negative for cough and shortness of breath.   Cardiovascular: Negative.  Negative for chest pain, palpitations and claudication.  Gastrointestinal: Negative.  Negative for abdominal pain, diarrhea, nausea and vomiting.  Genitourinary: Negative.  Negative for dysuria and hematuria.  Musculoskeletal: Negative.  Negative for back pain, myalgias and neck pain.  Skin: Negative.  Negative for rash.  Neurological: Negative.  Negative for dizziness and headaches.  Endo/Heme/Allergies: Negative.   All other systems reviewed and are negative.   Vitals:   08/14/18 1350 08/14/18 1351  BP: (!) 170/98 (!) 163/95  Pulse: 88   Resp: 16   Temp: 98 F (36.7 C)   SpO2: 97%     Physical Exam  Constitutional: She is oriented to person, place, and time. She appears well-developed and well-nourished.  HENT:  Head: Normocephalic and atraumatic.  Right Ear: External ear normal.  Left Ear: External ear normal.  Nose: Nose normal.  Mouth/Throat: Oropharynx is clear and moist.  Eyes: Pupils are equal, round, and reactive to light. Conjunctivae and EOM are normal.  Neck: Normal range of motion. Neck supple.  Cardiovascular:  Normal rate, regular rhythm, normal heart sounds and intact distal pulses.  Pulmonary/Chest: Effort normal and breath sounds normal.  Abdominal: Soft. Bowel sounds are normal. She exhibits no distension and no mass. There is no tenderness.  Musculoskeletal: Normal range of motion. She exhibits no edema or tenderness.  Lymphadenopathy:    She has no cervical adenopathy.  Neurological: She is alert and oriented to person, place, and time. She displays normal reflexes. No sensory deficit. She exhibits normal muscle tone. Coordination normal.  Skin: Skin is warm and dry. Capillary refill takes less than 2 seconds. No rash noted.  Psychiatric: She has a normal mood and affect. Her behavior is normal.  Vitals reviewed.    ASSESSMENT & PLAN: Arria was seen today for annual exam.  Diagnoses and all orders for this visit:  Encounter for general adult medical examination with abnormal findings  Need for prophylactic vaccination and inoculation against influenza  Essential hypertension, benign -     lisinopril-hydrochlorothiazide (ZESTORETIC) 20-12.5 MG tablet; Take 1 tablet by mouth daily. -     CBC with Differential/Platelet -     Comprehensive metabolic panel -     Hemoglobin A1c  Uncontrolled hypertension  Screening for endocrine, metabolic and immunity disorder  Other orders -     Flu Vaccine QUAD 6+ mos PF IM (Fluarix Quad PF)    Patient Instructions       If you have lab work done today you will be contacted with your lab results within the next 2 weeks.  If you have not heard from [place] then please contact us. The fastest way to get your results is to register for My Chart.   IF you received an x-ray today, you will receive an invoice from [facility]. Please contact [facility] at [phone] with questions or concerns regarding your invoice.   IF you received labwork today, you will receive an invoice from [facility]. Please contact [facility] at  [phone] with questions or concerns regarding your invoice.   Our billing staff will not be able to assist you with questions regarding bills from these companies.  You will be contacted with the lab results as soon as they are available. The fastest way to get your results is to activate your My Chart account. Instructions are located on the last page of this paperwork. If you have not heard from [place] regarding the results in 2 weeks, please contact this office.      Health Maintenance, Female Adopting a healthy lifestyle and getting preventive care can go a long way to promote health and wellness. Talk with your health care provider about what schedule of regular examinations is right for you. This is a good chance for you to check in with your provider about disease prevention and staying healthy. In between checkups, there are plenty of things you can do on your own. Experts have done a lot of research about which lifestyle changes and preventive measures are most likely to keep you healthy. Ask your health care provider for more information. Weight and diet Eat a healthy diet  Be sure to include plenty of vegetables, fruits, low-fat dairy products, and lean protein.  Do not eat a lot of foods high in solid fats, added sugars, or salt.  Get regular exercise. This is one of the most important things you can do for your health. ? Most adults should exercise for at least 150 minutes each week. The exercise should increase your heart rate and make you sweat (moderate-intensity exercise). ? Most adults should also do strengthening exercises at least twice a week. This is in addition to the moderate-intensity exercise.  Maintain a healthy weight  Body mass index (BMI) is a measurement that can be used to identify possible weight problems. It estimates body fat based  on height and weight. Your health care provider can help determine your BMI and help you achieve or maintain a healthy  weight.  For females 51 years of age and older: ? A BMI below 18.5 is considered underweight. ? A BMI of 18.5 to 24.9 is normal. ? A BMI of 25 to 29.9 is considered overweight. ? A BMI of 30 and above is considered obese.  Watch levels of cholesterol and blood lipids  You should start having your blood tested for lipids and cholesterol at 53 years of age, then have this test every 5 years.  You may need to have your cholesterol levels checked more often if: ? Your lipid or cholesterol levels are high. ? You are older than 53 years of age. ? You are at high risk for heart disease.  Cancer screening Lung Cancer  Lung cancer screening is recommended for adults 77-64 years old who are at high risk for lung cancer because of a history of smoking.  A yearly low-dose CT scan of the lungs is recommended for people who: ? Currently smoke. ? Have quit within the past 15 years. ? Have at least a 30-pack-year history of smoking. A pack year is smoking an average of one pack of cigarettes a day for 1 year.  Yearly screening should continue until it has been 15 years since you quit.  Yearly screening should stop if you develop a health problem that would prevent you from having lung cancer treatment.  Breast Cancer  Practice breast self-awareness. This means understanding how your breasts normally appear and feel.  It also means doing regular breast self-exams. Let your health care provider know about any changes, no matter how small.  If you are in your 20s or 30s, you should have a clinical breast exam (CBE) by a health care provider every 1-3 years as part of a regular health exam.  If you are 28 or older, have a CBE every year. Also consider having a breast X-ray (mammogram) every year.  If you have a family history of breast cancer, talk to your health care provider about genetic screening.  If you are at high risk for breast cancer, talk to your health care provider about having an  MRI and a mammogram every year.  Breast cancer gene (BRCA) assessment is recommended for women who have family members with BRCA-related cancers. BRCA-related cancers include: ? Breast. ? Ovarian. ? Tubal. ? Peritoneal cancers.  Results of the assessment will determine the need for genetic counseling and BRCA1 and BRCA2 testing.  Cervical Cancer Your health care provider may recommend that you be screened regularly for cancer of the pelvic organs (ovaries, uterus, and vagina). This screening involves a pelvic examination, including checking for microscopic changes to the surface of your cervix (Pap test). You may be encouraged to have this screening done every 3 years, beginning at age 15.  For women ages 12-65, health care providers may recommend pelvic exams and Pap testing every 3 years, or they may recommend the Pap and pelvic exam, combined with testing for human papilloma virus (HPV), every 5 years. Some types of HPV increase your risk of cervical cancer. Testing for HPV may also be done on women of any age with unclear Pap test results.  Other health care providers may not recommend any screening for nonpregnant women who are considered low risk for pelvic cancer and who do not have symptoms. Ask your health care provider if a screening pelvic exam is right  for you.  If you have had past treatment for cervical cancer or a condition that could lead to cancer, you need Pap tests and screening for cancer for at least 20 years after your treatment. If Pap tests have been discontinued, your risk factors (such as having a new sexual partner) need to be reassessed to determine if screening should resume. Some women have medical problems that increase the chance of getting cervical cancer. In these cases, your health care provider may recommend more frequent screening and Pap tests.  Colorectal Cancer  This type of cancer can be detected and often prevented.  Routine colorectal cancer screening  usually begins at 53 years of age and continues through 53 years of age.  Your health care provider may recommend screening at an earlier age if you have risk factors for colon cancer.  Your health care provider may also recommend using home test kits to check for hidden blood in the stool.  A small camera at the end of a tube can be used to examine your colon directly (sigmoidoscopy or colonoscopy). This is done to check for the earliest forms of colorectal cancer.  Routine screening usually begins at age 14.  Direct examination of the colon should be repeated every 5-10 years through 53 years of age. However, you may need to be screened more often if early forms of precancerous polyps or small growths are found.  Skin Cancer  Check your skin from head to toe regularly.  Tell your health care provider about any new moles or changes in moles, especially if there is a change in a mole's shape or color.  Also tell your health care provider if you have a mole that is larger than the size of a pencil eraser.  Always use sunscreen. Apply sunscreen liberally and repeatedly throughout the day.  Protect yourself by wearing long sleeves, pants, a wide-brimmed hat, and sunglasses whenever you are outside.  Heart disease, diabetes, and high blood pressure  High blood pressure causes heart disease and increases the risk of stroke. High blood pressure is more likely to develop in: ? People who have blood pressure in the high end of the normal range (130-139/85-89 mm Hg). ? People who are overweight or obese. ? People who are African American.  If you are 19-34 years of age, have your blood pressure checked every 3-5 years. If you are 44 years of age or older, have your blood pressure checked every year. You should have your blood pressure measured twice-once when you are at a hospital or clinic, and once when you are not at a hospital or clinic. Record the average of the two measurements. To check  your blood pressure when you are not at a hospital or clinic, you can use: ? An automated blood pressure machine at a pharmacy. ? A home blood pressure monitor.  If you are between 55 years and 23 years old, ask your health care provider if you should take aspirin to prevent strokes.  Have regular diabetes screenings. This involves taking a blood sample to check your fasting blood sugar level. ? If you are at a normal weight and have a low risk for diabetes, have this test once every three years after 53 years of age. ? If you are overweight and have a high risk for diabetes, consider being tested at a younger age or more often. Preventing infection Hepatitis B  If you have a higher risk for hepatitis B, you should be screened for  this virus. You are considered at high risk for hepatitis B if: ? You were born in a country where hepatitis B is common. Ask your health care provider which countries are considered high risk. ? Your parents were born in a high-risk country, and you have not been immunized against hepatitis B (hepatitis B vaccine). ? You have HIV or AIDS. ? You use needles to inject street drugs. ? You live with someone who has hepatitis B. ? You have had sex with someone who has hepatitis B. ? You get hemodialysis treatment. ? You take certain medicines for conditions, including cancer, organ transplantation, and autoimmune conditions.  Hepatitis C  Blood testing is recommended for: ? Everyone born from [birth year] through 1966. ? Anyone with known risk factors for hepatitis C.  Sexually transmitted infections (STIs)  You should be screened for sexually transmitted infections (STIs) including gonorrhea and chlamydia if: ? You are sexually active and are younger than 53 years of age. ? You are older than 53 years of age and your health care provider tells you that you are at risk for this type of infection. ? Your sexual activity has changed since you were last screened and you  are at an increased risk for chlamydia or gonorrhea. Ask your health care provider if you are at risk.  If you do not have HIV, but are at risk, it may be recommended that you take a prescription medicine daily to prevent HIV infection. This is called pre-exposure prophylaxis (PrEP). You are considered at risk if: ? You are sexually active and do not regularly use condoms or know the HIV status of your partner(s). ? You take drugs by injection. ? You are sexually active with a partner who has HIV.  Talk with your health care provider about whether you are at high risk of being infected with HIV. If you choose to begin PrEP, you should first be tested for HIV. You should then be tested every 3 months for as long as you are taking PrEP. Pregnancy  If you are premenopausal and you may become pregnant, ask your health care provider about preconception counseling.  If you may become pregnant, take 400 to 800 micrograms (mcg) of folic acid every day.  If you want to prevent pregnancy, talk to your health care provider about birth control (contraception). Osteoporosis and menopause  Osteoporosis is a disease in which the bones lose minerals and strength with aging. This can result in serious bone fractures. Your risk for osteoporosis can be identified using a bone density scan.  If you are 6 years of age or older, or if you are at risk for osteoporosis and fractures, ask your health care provider if you should be screened.  Ask your health care provider whether you should take a calcium or vitamin D supplement to lower your risk for osteoporosis.  Menopause may have certain physical symptoms and risks.  Hormone replacement therapy may reduce some of these symptoms and risks. Talk to your health care provider about whether hormone replacement therapy is right for you. Follow these instructions at home:  Schedule regular health, dental, and eye exams.  Stay current with your  immunizations.  Do not use any tobacco products including cigarettes, chewing tobacco, or electronic cigarettes.  If you are pregnant, do not drink alcohol.  If you are breastfeeding, limit how much and how often you drink alcohol.  Limit alcohol intake to no more than 1 drink per day for nonpregnant women. One drink  equals 12 ounces of beer, 5 ounces of wine, or 1 ounces of hard liquor.  Do not use street drugs.  Do not share needles.  Ask your health care provider for help if you need support or information about quitting drugs.  Tell your health care provider if you often feel depressed.  Tell your health care provider if you have ever been abused or do not feel safe at home. This information is not intended to replace advice given to you by your health care provider. Make sure you discuss any questions you have with your health care provider. Document Released: 03/25/2011 Document Revised: 02/15/2016 Document Reviewed: 06/13/2015 Elsevier Interactive Patient Education  2018 [employer].  Hypertension Hypertension, commonly called high blood pressure, is when the force of blood pumping through the arteries is too strong. The arteries are the blood vessels that carry blood from the heart throughout the body. Hypertension forces the heart to work harder to pump blood and may cause arteries to become narrow or stiff. Having untreated or uncontrolled hypertension can cause heart attacks, strokes, kidney disease, and other problems. A blood pressure reading consists of a higher number over a lower number. Ideally, your blood pressure should be below 120/80. The first ("top") number is called the systolic pressure. It is a measure of the pressure in your arteries as your heart beats. The second ("bottom") number is called the diastolic pressure. It is a measure of the pressure in your arteries as the heart relaxes. What are the causes? The cause of this condition is not known. What  increases the risk? Some risk factors for high blood pressure are under your control. Others are not. Factors you can change  Smoking.  Having type 2 diabetes mellitus, high cholesterol, or both.  Not getting enough exercise or physical activity.  Being overweight.  Having too much fat, sugar, calories, or salt (sodium) in your diet.  Drinking too much alcohol. Factors that are difficult or impossible to change  Having chronic kidney disease.  Having a family history of high blood pressure.  Age. Risk increases with age.  Race. You may be at higher risk if you are African-American.  Gender. Men are at higher risk than women before age 84. After age 55, women are at higher risk than men.  Having obstructive sleep apnea.  Stress. What are the signs or symptoms? Extremely high blood pressure (hypertensive crisis) may cause:  Headache.  Anxiety.  Shortness of breath.  Nosebleed.  Nausea and vomiting.  Severe chest pain.  Jerky movements you cannot control (seizures).  How is this diagnosed? This condition is diagnosed by measuring your blood pressure while you are seated, with your arm resting on a surface. The cuff of the blood pressure monitor will be placed directly against the skin of your upper arm at the level of your heart. It should be measured at least twice using the same arm. Certain conditions can cause a difference in blood pressure between your right and left arms. Certain factors can cause blood pressure readings to be lower or higher than normal (elevated) for a short period of time:  When your blood pressure is higher when you are in a health care provider's office than when you are at home, this is called white coat hypertension. Most people with this condition do not need medicines.  When your blood pressure is higher at home than when you are in a health care provider's office, this is called masked hypertension. Most people with  this condition may  need medicines to control blood pressure.  If you have a high blood pressure reading during one visit or you have normal blood pressure with other risk factors:  You may be asked to return on a different day to have your blood pressure checked again.  You may be asked to monitor your blood pressure at home for 1 week or longer.  If you are diagnosed with hypertension, you may have other blood or imaging tests to help your health care provider understand your overall risk for other conditions. How is this treated? This condition is treated by making healthy lifestyle changes, such as eating healthy foods, exercising more, and reducing your alcohol intake. Your health care provider may prescribe medicine if lifestyle changes are not enough to get your blood pressure under control, and if:  Your systolic blood pressure is above 130.  Your diastolic blood pressure is above 80.  Your personal target blood pressure may vary depending on your medical conditions, your age, and other factors. Follow these instructions at home: Eating and drinking  Eat a diet that is high in fiber and potassium, and low in sodium, added sugar, and fat. An example eating plan is called the DASH (Dietary Approaches to Stop Hypertension) diet. To eat this way: ? Eat plenty of fresh fruits and vegetables. Try to fill half of your plate at each meal with fruits and vegetables. ? Eat whole grains, such as whole wheat pasta, brown rice, or whole grain bread. Fill about one quarter of your plate with whole grains. ? Eat or drink low-fat dairy products, such as skim milk or low-fat yogurt. ? Avoid fatty cuts of meat, processed or cured meats, and poultry with skin. Fill about one quarter of your plate with lean proteins, such as fish, chicken without skin, beans, eggs, and tofu. ? Avoid premade and processed foods. These tend to be higher in sodium, added sugar, and fat.  Reduce your daily sodium intake. Most people with  hypertension should eat less than 1,500 mg of sodium a day.  Limit alcohol intake to no more than 1 drink a day for nonpregnant women and 2 drinks a day for men. One drink equals 12 oz of beer, 5 oz of wine, or 1 oz of hard liquor. Lifestyle  Work with your health care provider to maintain a healthy body weight or to lose weight. Ask what an ideal weight is for you.  Get at least 30 minutes of exercise that causes your heart to beat faster (aerobic exercise) most days of the week. Activities may include walking, swimming, or biking.  Include exercise to strengthen your muscles (resistance exercise), such as pilates or lifting weights, as part of your weekly exercise routine. Try to do these types of exercises for 30 minutes at least 3 days a week.  Do not use any products that contain nicotine or tobacco, such as cigarettes and e-cigarettes. If you need help quitting, ask your health care provider.  Monitor your blood pressure at home as told by your health care provider.  Keep all follow-up visits as told by your health care provider. This is important. Medicines  Take over-the-counter and prescription medicines only as told by your health care provider. Follow directions carefully. Blood pressure medicines must be taken as prescribed.  Do not skip doses of blood pressure medicine. Doing this puts you at risk for problems and can make the medicine less effective.  Ask your health care provider about side effects  or reactions to medicines that you should watch for. Contact a health care provider if:  You think you are having a reaction to a medicine you are taking.  You have headaches that keep coming back (recurring).  You feel dizzy.  You have swelling in your ankles.  You have trouble with your vision. Get help right away if:  You develop a severe headache or confusion.  You have unusual weakness or numbness.  You feel faint.  You have severe pain in your chest or  abdomen.  You vomit repeatedly.  You have trouble breathing. Summary  Hypertension is when the force of blood pumping through your arteries is too strong. If this condition is not controlled, it may put you at risk for serious complications.  Your personal target blood pressure may vary depending on your medical conditions, your age, and other factors. For most people, a normal blood pressure is less than 120/80.  Hypertension is treated with lifestyle changes, medicines, or a combination of both. Lifestyle changes include weight loss, eating a healthy, low-sodium diet, exercising more, and limiting alcohol. This information is not intended to replace advice given to you by your health care provider. Make sure you discuss any questions you have with your health care provider. Document Released: 09/09/2005 Document Revised: 08/07/2016 Document Reviewed: 08/07/2016 [employer] Interactive Patient Education  2018 [employer]      Agustina Caroli, MD [facility]

## 2018-08-14 NOTE — Patient Instructions (Addendum)
   If you have lab work done today you will be contacted with your lab results within the next 2 weeks.  If you have not heard from us then please contact us. The fastest way to get your results is to register for My Chart.   IF you received an x-ray today, you will receive an invoice from [facility]. Please contact [facility] at [phone] with questions or concerns regarding your invoice.   IF you received labwork today, you will receive an invoice from [facility]. Please contact [facility] at [phone] with questions or concerns regarding your invoice.   Our billing staff will not be able to assist you with questions regarding bills from these companies.  You will be contacted with the lab results as soon as they are available. The fastest way to get your results is to activate your My Chart account. Instructions are located on the last page of this paperwork. If you have not heard from us regarding the results in 2 weeks, please contact this office.     Health Maintenance, Female Adopting a healthy lifestyle and getting preventive care can go a long way to promote health and wellness. Talk with your health care provider about what schedule of regular examinations is right for you. This is a good chance for you to check in with your provider about disease prevention and staying healthy. In between checkups, there are plenty of things you can do on your own. Experts have done a lot of research about which lifestyle changes and preventive measures are most likely to keep you healthy. Ask your health care provider for more information. Weight and diet Eat a healthy diet  Be sure to include plenty of vegetables, fruits, low-fat dairy products, and lean protein.  Do not eat a lot of foods high in solid fats, added sugars, or salt.  Get regular exercise. This is one of the most important things you can do for your health. ? Most adults should exercise for at least 150  minutes each week. The exercise should increase your heart rate and make you sweat (moderate-intensity exercise). ? Most adults should also do strengthening exercises at least twice a week. This is in addition to the moderate-intensity exercise.  Maintain a healthy weight  Body mass index (BMI) is a measurement that can be used to identify possible weight problems. It estimates body fat based on height and weight. Your health care provider can help determine your BMI and help you achieve or maintain a healthy weight.  For females 20 years of age and older: ? A BMI below 18.5 is considered underweight. ? A BMI of 18.5 to 24.9 is normal. ? A BMI of 25 to 29.9 is considered overweight. ? A BMI of 30 and above is considered obese.  Watch levels of cholesterol and blood lipids  You should start having your blood tested for lipids and cholesterol at 53 years of age, then have this test every 5 years.  You may need to have your cholesterol levels checked more often if: ? Your lipid or cholesterol levels are high. ? You are older than 53 years of age. ? You are at high risk for heart disease.  Cancer screening Lung Cancer  Lung cancer screening is recommended for adults 55-80 years old who are at high risk for lung cancer because of a history of smoking.  A yearly low-dose CT scan of the lungs is recommended for people who: ? Currently smoke. ? Have quit   within the past 15 years. ? Have at least a 30-pack-year history of smoking. A pack year is smoking an average of one pack of cigarettes a day for 1 year.  Yearly screening should continue until it has been 15 years since you quit.  Yearly screening should stop if you develop a health problem that would prevent you from having lung cancer treatment.  Breast Cancer  Practice breast self-awareness. This means understanding how your breasts normally appear and feel.  It also means doing regular breast self-exams. Let your health care  provider know about any changes, no matter how small.  If you are in your 20s or 30s, you should have a clinical breast exam (CBE) by a health care provider every 1-3 years as part of a regular health exam.  If you are 40 or older, have a CBE every year. Also consider having a breast X-ray (mammogram) every year.  If you have a family history of breast cancer, talk to your health care provider about genetic screening.  If you are at high risk for breast cancer, talk to your health care provider about having an MRI and a mammogram every year.  Breast cancer gene (BRCA) assessment is recommended for women who have family members with BRCA-related cancers. BRCA-related cancers include: ? Breast. ? Ovarian. ? Tubal. ? Peritoneal cancers.  Results of the assessment will determine the need for genetic counseling and BRCA1 and BRCA2 testing.  Cervical Cancer Your health care provider may recommend that you be screened regularly for cancer of the pelvic organs (ovaries, uterus, and vagina). This screening involves a pelvic examination, including checking for microscopic changes to the surface of your cervix (Pap test). You may be encouraged to have this screening done every 3 years, beginning at age 21.  For women ages 30-65, health care providers may recommend pelvic exams and Pap testing every 3 years, or they may recommend the Pap and pelvic exam, combined with testing for human papilloma virus (HPV), every 5 years. Some types of HPV increase your risk of cervical cancer. Testing for HPV may also be done on women of any age with unclear Pap test results.  Other health care providers may not recommend any screening for nonpregnant women who are considered low risk for pelvic cancer and who do not have symptoms. Ask your health care provider if a screening pelvic exam is right for you.  If you have had past treatment for cervical cancer or a condition that could lead to cancer, you need Pap tests  and screening for cancer for at least 20 years after your treatment. If Pap tests have been discontinued, your risk factors (such as having a new sexual partner) need to be reassessed to determine if screening should resume. Some women have medical problems that increase the chance of getting cervical cancer. In these cases, your health care provider may recommend more frequent screening and Pap tests.  Colorectal Cancer  This type of cancer can be detected and often prevented.  Routine colorectal cancer screening usually begins at 53 years of age and continues through 53 years of age.  Your health care provider may recommend screening at an earlier age if you have risk factors for colon cancer.  Your health care provider may also recommend using home test kits to check for hidden blood in the stool.  A small camera at the end of a tube can be used to examine your colon directly (sigmoidoscopy or colonoscopy). This is done to check   for the earliest forms of colorectal cancer.  Routine screening usually begins at age 50.  Direct examination of the colon should be repeated every 5-10 years through 53 years of age. However, you may need to be screened more often if early forms of precancerous polyps or small growths are found.  Skin Cancer  Check your skin from head to toe regularly.  Tell your health care provider about any new moles or changes in moles, especially if there is a change in a mole's shape or color.  Also tell your health care provider if you have a mole that is larger than the size of a pencil eraser.  Always use sunscreen. Apply sunscreen liberally and repeatedly throughout the day.  Protect yourself by wearing long sleeves, pants, a wide-brimmed hat, and sunglasses whenever you are outside.  Heart disease, diabetes, and high blood pressure  High blood pressure causes heart disease and increases the risk of stroke. High blood pressure is more likely to develop  in: ? People who have blood pressure in the high end of the normal range (130-139/85-89 mm Hg). ? People who are overweight or obese. ? People who are African American.  If you are 18-39 years of age, have your blood pressure checked every 3-5 years. If you are 40 years of age or older, have your blood pressure checked every year. You should have your blood pressure measured twice-once when you are at a hospital or clinic, and once when you are not at a hospital or clinic. Record the average of the two measurements. To check your blood pressure when you are not at a hospital or clinic, you can use: ? An automated blood pressure machine at a pharmacy. ? A home blood pressure monitor.  If you are between 55 years and 79 years old, ask your health care provider if you should take aspirin to prevent strokes.  Have regular diabetes screenings. This involves taking a blood sample to check your fasting blood sugar level. ? If you are at a normal weight and have a low risk for diabetes, have this test once every three years after 53 years of age. ? If you are overweight and have a high risk for diabetes, consider being tested at a younger age or more often. Preventing infection Hepatitis B  If you have a higher risk for hepatitis B, you should be screened for this virus. You are considered at high risk for hepatitis B if: ? You were born in a country where hepatitis B is common. Ask your health care provider which countries are considered high risk. ? Your parents were born in a high-risk country, and you have not been immunized against hepatitis B (hepatitis B vaccine). ? You have HIV or AIDS. ? You use needles to inject street drugs. ? You live with someone who has hepatitis B. ? You have had sex with someone who has hepatitis B. ? You get hemodialysis treatment. ? You take certain medicines for conditions, including cancer, organ transplantation, and autoimmune conditions.  Hepatitis C  Blood  testing is recommended for: ? Everyone born from 1945 through 1965. ? Anyone with known risk factors for hepatitis C.  Sexually transmitted infections (STIs)  You should be screened for sexually transmitted infections (STIs) including gonorrhea and chlamydia if: ? You are sexually active and are younger than 53 years of age. ? You are older than 53 years of age and your health care provider tells you that you are at risk for   for this type of infection. ? Your sexual activity has changed since you were last screened and you are at an increased risk for chlamydia or gonorrhea. Ask your health care provider if you are at risk.  If you do not have HIV, but are at risk, it may be recommended that you take a prescription medicine daily to prevent HIV infection. This is called pre-exposure prophylaxis (PrEP). You are considered at risk if: ? You are sexually active and do not regularly use condoms or know the HIV status of your partner(s). ? You take drugs by injection. ? You are sexually active with a partner who has HIV.  Talk with your health care provider about whether you are at high risk of being infected with HIV. If you choose to begin PrEP, you should first be tested for HIV. You should then be tested every 3 months for as long as you are taking PrEP. Pregnancy  If you are premenopausal and you may become pregnant, ask your health care provider about preconception counseling.  If you may become pregnant, take 400 to 800 micrograms (mcg) of folic acid every day.  If you want to prevent pregnancy, talk to your health care provider about birth control (contraception). Osteoporosis and menopause  Osteoporosis is a disease in which the bones lose minerals and strength with aging. This can result in serious bone fractures. Your risk for osteoporosis can be identified using a bone density scan.  If you are 80 years of age or older, or if you are at risk for osteoporosis and fractures, ask your  health care provider if you should be screened.  Ask your health care provider whether you should take a calcium or vitamin D supplement to lower your risk for osteoporosis.  Menopause may have certain physical symptoms and risks.  Hormone replacement therapy may reduce some of these symptoms and risks. Talk to your health care provider about whether hormone replacement therapy is right for you. Follow these instructions at home:  Schedule regular health, dental, and eye exams.  Stay current with your immunizations.  Do not use any tobacco products including cigarettes, chewing tobacco, or electronic cigarettes.  If you are pregnant, do not drink alcohol.  If you are breastfeeding, limit how much and how often you drink alcohol.  Limit alcohol intake to no more than 1 drink per day for nonpregnant women. One drink equals 12 ounces of beer, 5 ounces of wine, or 1 ounces of hard liquor.  Do not use street drugs.  Do not share needles.  Ask your health care provider for help if you need support or information about quitting drugs.  Tell your health care provider if you often feel depressed.  Tell your health care provider if you have ever been abused or do not feel safe at home. This information is not intended to replace advice given to you by your health care provider. Make sure you discuss any questions you have with your health care provider. Document Released: 03/25/2011 Document Revised: 02/15/2016 Document Reviewed: 06/13/2015 Elsevier Interactive Patient Education  2018 [employer].  Hypertension Hypertension, commonly called high blood pressure, is when the force of blood pumping through the arteries is too strong. The arteries are the blood vessels that carry blood from the heart throughout the body. Hypertension forces the heart to work harder to pump blood and may cause arteries to become narrow or stiff. Having untreated or uncontrolled hypertension can cause heart  attacks, strokes, kidney disease, and other  problems. A blood pressure reading consists of a higher number over a lower number. Ideally, your blood pressure should be below 120/80. The first ("top") number is called the systolic pressure. It is a measure of the pressure in your arteries as your heart beats. The second ("bottom") number is called the diastolic pressure. It is a measure of the pressure in your arteries as the heart relaxes. What are the causes? The cause of this condition is not known. What increases the risk? Some risk factors for high blood pressure are under your control. Others are not. Factors you can change  Smoking.  Having type 2 diabetes mellitus, high cholesterol, or both.  Not getting enough exercise or physical activity.  Being overweight.  Having too much fat, sugar, calories, or salt (sodium) in your diet.  Drinking too much alcohol. Factors that are difficult or impossible to change  Having chronic kidney disease.  Having a family history of high blood pressure.  Age. Risk increases with age.  Race. You may be at higher risk if you are African-American.  Gender. Men are at higher risk than women before age 67. After age 17, women are at higher risk than men.  Having obstructive sleep apnea.  Stress. What are the signs or symptoms? Extremely high blood pressure (hypertensive crisis) may cause:  Headache.  Anxiety.  Shortness of breath.  Nosebleed.  Nausea and vomiting.  Severe chest pain.  Jerky movements you cannot control (seizures).  How is this diagnosed? This condition is diagnosed by measuring your blood pressure while you are seated, with your arm resting on a surface. The cuff of the blood pressure monitor will be placed directly against the skin of your upper arm at the level of your heart. It should be measured at least twice using the same arm. Certain conditions can cause a difference in blood pressure between your right and  left arms. Certain factors can cause blood pressure readings to be lower or higher than normal (elevated) for a short period of time:  When your blood pressure is higher when you are in a health care provider's office than when you are at home, this is called white coat hypertension. Most people with this condition do not need medicines.  When your blood pressure is higher at home than when you are in a health care provider's office, this is called masked hypertension. Most people with this condition may need medicines to control blood pressure.  If you have a high blood pressure reading during one visit or you have normal blood pressure with other risk factors:  You may be asked to return on a different day to have your blood pressure checked again.  You may be asked to monitor your blood pressure at home for 1 week or longer.  If you are diagnosed with hypertension, you may have other blood or imaging tests to help your health care provider understand your overall risk for other conditions. How is this treated? This condition is treated by making healthy lifestyle changes, such as eating healthy foods, exercising more, and reducing your alcohol intake. Your health care provider may prescribe medicine if lifestyle changes are not enough to get your blood pressure under control, and if:  Your systolic blood pressure is above 130.  Your diastolic blood pressure is above 80.  Your personal target blood pressure may vary depending on your medical conditions, your age, and other factors. Follow these instructions at home: Eating and drinking  Eat a diet that  is high in fiber and potassium, and low in sodium, added sugar, and fat. An example eating plan is called the DASH (Dietary Approaches to Stop Hypertension) diet. To eat this way: ? Eat plenty of fresh fruits and vegetables. Try to fill half of your plate at each meal with fruits and vegetables. ? Eat whole grains, such as whole wheat  pasta, brown rice, or whole grain bread. Fill about one quarter of your plate with whole grains. ? Eat or drink low-fat dairy products, such as skim milk or low-fat yogurt. ? Avoid fatty cuts of meat, processed or cured meats, and poultry with skin. Fill about one quarter of your plate with lean proteins, such as fish, chicken without skin, beans, eggs, and tofu. ? Avoid premade and processed foods. These tend to be higher in sodium, added sugar, and fat.  Reduce your daily sodium intake. Most people with hypertension should eat less than 1,500 mg of sodium a day.  Limit alcohol intake to no more than 1 drink a day for nonpregnant women and 2 drinks a day for men. One drink equals 12 oz of beer, 5 oz of wine, or 1 oz of hard liquor. Lifestyle  Work with your health care provider to maintain a healthy body weight or to lose weight. Ask what an ideal weight is for you.  Get at least 30 minutes of exercise that causes your heart to beat faster (aerobic exercise) most days of the week. Activities may include walking, swimming, or biking.  Include exercise to strengthen your muscles (resistance exercise), such as pilates or lifting weights, as part of your weekly exercise routine. Try to do these types of exercises for 30 minutes at least 3 days a week.  Do not use any products that contain nicotine or tobacco, such as cigarettes and e-cigarettes. If you need help quitting, ask your health care provider.  Monitor your blood pressure at home as told by your health care provider.  Keep all follow-up visits as told by your health care provider. This is important. Medicines  Take over-the-counter and prescription medicines only as told by your health care provider. Follow directions carefully. Blood pressure medicines must be taken as prescribed.  Do not skip doses of blood pressure medicine. Doing this puts you at risk for problems and can make the medicine less effective.  Ask your health care  provider about side effects or reactions to medicines that you should watch for. Contact a health care provider if:  You think you are having a reaction to a medicine you are taking.  You have headaches that keep coming back (recurring).  You feel dizzy.  You have swelling in your ankles.  You have trouble with your vision. Get help right away if:  You develop a severe headache or confusion.  You have unusual weakness or numbness.  You feel faint.  You have severe pain in your chest or abdomen.  You vomit repeatedly.  You have trouble breathing. Summary  Hypertension is when the force of blood pumping through your arteries is too strong. If this condition is not controlled, it may put you at risk for serious complications.  Your personal target blood pressure may vary depending on your medical conditions, your age, and other factors. For most people, a normal blood pressure is less than 120/80.  Hypertension is treated with lifestyle changes, medicines, or a combination of both. Lifestyle changes include weight loss, eating a healthy, low-sodium diet, exercising more, and limiting alcohol. This  information is not intended to replace advice given to you by your health care provider. Make sure you discuss any questions you have with your health care provider. Document Released: 09/09/2005 Document Revised: 08/07/2016 Document Reviewed: 08/07/2016 Elsevier Interactive Patient Education  [employer].

## 2018-08-15 LAB — COMPREHENSIVE METABOLIC PANEL
ALT: 9 IU/L (ref 0–32)
AST: 20 IU/L (ref 0–40)
Albumin/Globulin Ratio: 1.6 (ref 1.2–2.2)
Albumin: 4.4 g/dL (ref 3.5–5.5)
Alkaline Phosphatase: 84 IU/L (ref 39–117)
BILIRUBIN TOTAL: 0.4 mg/dL (ref 0.0–1.2)
BUN / CREAT RATIO: 13 (ref 9–23)
BUN: 11 mg/dL (ref 6–24)
CHLORIDE: 105 mmol/L (ref 96–106)
CO2: 21 mmol/L (ref 20–29)
Calcium: 9.9 mg/dL (ref 8.7–10.2)
Creatinine, Ser: 0.84 mg/dL (ref 0.57–1.00)
GFR calc Af Amer: 92 mL/min/{1.73_m2} (ref >59)
GFR, EST NON AFRICAN AMERICAN: 80 mL/min/{1.73_m2} (ref >59)
GLUCOSE: 84 mg/dL (ref 65–99)
Globulin, Total: 2.8 g/dL (ref 1.5–4.5)
Potassium: 4.1 mmol/L (ref 3.5–5.2)
Sodium: 144 mmol/L (ref 134–144)
TOTAL PROTEIN: 7.2 g/dL (ref 6.0–8.5)

## 2018-08-15 LAB — HEMOGLOBIN A1C
Est. average glucose Bld gHb Est-mCnc: 97 mg/dL
Hgb A1c MFr Bld: 5 % (ref 4.8–5.6)

## 2018-08-15 LAB — CBC WITH DIFFERENTIAL/PLATELET
BASOS ABS: 0 10*3/uL (ref 0.0–0.2)
BASOS: 0 %
EOS (ABSOLUTE): 0.1 10*3/uL (ref 0.0–0.4)
Eos: 2 %
Hematocrit: 34.6 % (ref 34.0–46.6)
Hemoglobin: 11.5 g/dL (ref 11.1–15.9)
IMMATURE GRANS (ABS): 0 10*3/uL (ref 0.0–0.1)
Immature Granulocytes: 0 %
LYMPHS: 32 %
Lymphocytes Absolute: 2.2 10*3/uL (ref 0.7–3.1)
MCH: 30.2 pg (ref 26.6–33.0)
MCHC: 33.2 g/dL (ref 31.5–35.7)
MCV: 91 fL (ref 79–97)
MONOCYTES: 5 %
Monocytes Absolute: 0.3 10*3/uL (ref 0.1–0.9)
NEUTROS ABS: 4.1 10*3/uL (ref 1.4–7.0)
Neutrophils: 61 %
Platelets: 316 10*3/uL (ref 150–450)
RBC: 3.81 x10E6/uL (ref 3.77–5.28)
RDW: 15.4 % (ref 12.3–15.4)
WBC: 6.8 10*3/uL (ref 3.4–10.8)

## 2018-08-17 ENCOUNTER — Encounter: Payer: Self-pay | Admitting: *Deleted

## 2018-12-25 ENCOUNTER — Other Ambulatory Visit: Payer: Self-pay | Admitting: Emergency Medicine

## 2018-12-25 DIAGNOSIS — Z1231 Encounter for screening mammogram for malignant neoplasm of breast: Secondary | ICD-10-CM

## 2019-02-12 ENCOUNTER — Ambulatory Visit: Payer: Managed Care, Other (non HMO) | Admitting: Emergency Medicine

## 2019-02-19 ENCOUNTER — Ambulatory Visit: Payer: Managed Care, Other (non HMO)

## 2019-02-22 ENCOUNTER — Ambulatory Visit: Payer: Managed Care, Other (non HMO) | Admitting: Emergency Medicine

## 2019-08-25 ENCOUNTER — Other Ambulatory Visit: Payer: Self-pay

## 2019-08-25 ENCOUNTER — Ambulatory Visit
Admission: RE | Admit: 2019-08-25 | Discharge: 2019-08-25 | Disposition: A | Discharge status: Home / Self Care | Payer: Managed Care, Other (non HMO) | Source: Ambulatory Visit | Attending: Emergency Medicine | Admitting: Emergency Medicine

## 2019-08-25 DIAGNOSIS — Z1231 Encounter for screening mammogram for malignant neoplasm of breast: Secondary | ICD-10-CM

## 2019-10-15 ENCOUNTER — Other Ambulatory Visit: Payer: Self-pay | Admitting: Emergency Medicine

## 2019-10-15 DIAGNOSIS — I10 Essential (primary) hypertension: Secondary | ICD-10-CM

## 2019-10-15 NOTE — Telephone Encounter (Signed)
Forwarding medication refill request to the clinical pool for review. 

## 2019-10-18 NOTE — Telephone Encounter (Signed)
Pt needs refill but needs appt first. Has not been seen since 2019.  1. 30 day supply curtsey refill given

## 2019-10-19 NOTE — Telephone Encounter (Signed)
Called pt. And scheduled appt. For 11/01/2019  

## 2019-11-01 ENCOUNTER — Ambulatory Visit: Payer: Managed Care, Other (non HMO) | Admitting: Emergency Medicine

## 2019-11-08 ENCOUNTER — Other Ambulatory Visit: Payer: Self-pay

## 2019-11-08 ENCOUNTER — Encounter: Payer: Self-pay | Admitting: Emergency Medicine

## 2019-11-08 ENCOUNTER — Ambulatory Visit: Payer: Managed Care, Other (non HMO) | Admitting: Emergency Medicine

## 2019-11-08 VITALS — BP 120/80 | HR 88 | Temp 97.2°F | Resp 16 | Ht 65.5 in | Wt 240.0 lb

## 2019-11-08 DIAGNOSIS — Z1211 Encounter for screening for malignant neoplasm of colon: Secondary | ICD-10-CM | POA: Diagnosis not present

## 2019-11-08 DIAGNOSIS — I10 Essential (primary) hypertension: Secondary | ICD-10-CM | POA: Diagnosis not present

## 2019-11-08 MED ORDER — LISINOPRIL-HYDROCHLOROTHIAZIDE 20-12.5 MG PO TABS: 1.0000 | ORAL_TABLET | Freq: Every day | ORAL | 3 refills | Status: DC

## 2019-11-08 NOTE — Patient Instructions (Addendum)
   If you have lab work done today you will be contacted with your lab results within the next 2 weeks.  If you have not heard from us then please contact us. The fastest way to get your results is to register for My Chart.   IF you received an x-ray today, you will receive an invoice from [facility]. Please contact  Radiology at [phone] with questions or concerns regarding your invoice.   IF you received labwork today, you will receive an invoice from [facility]. Please contact [facility] at [phone] with questions or concerns regarding your invoice.   Our billing staff will not be able to assist you with questions regarding bills from these companies.  You will be contacted with the lab results as soon as they are available. The fastest way to get your results is to activate your My Chart account. Instructions are located on the last page of this paperwork. If you have not heard from us regarding the results in 2 weeks, please contact this office.      Hypertension, Adult High blood pressure (hypertension) is when the force of blood pumping through the arteries is too strong. The arteries are the blood vessels that carry blood from the heart throughout the body. Hypertension forces the heart to work harder to pump blood and may cause arteries to become narrow or stiff. Untreated or uncontrolled hypertension can cause a heart attack, heart failure, a stroke, kidney disease, and other problems. A blood pressure reading consists of a higher number over a lower number. Ideally, your blood pressure should be below 120/80. The first ("top") number is called the systolic pressure. It is a measure of the pressure in your arteries as your heart beats. The second ("bottom") number is called the diastolic pressure. It is a measure of the pressure in your arteries as the heart relaxes. What are the causes? The exact cause of this condition is not known. There are some conditions  that result in or are related to high blood pressure. What increases the risk? Some risk factors for high blood pressure are under your control. The following factors may make you more likely to develop this condition:  Smoking.  Having type 2 diabetes mellitus, high cholesterol, or both.  Not getting enough exercise or physical activity.  Being overweight.  Having too much fat, sugar, calories, or salt (sodium) in your diet.  Drinking too much alcohol. Some risk factors for high blood pressure may be difficult or impossible to change. Some of these factors include:  Having chronic kidney disease.  Having a family history of high blood pressure.  Age. Risk increases with age.  Race. You may be at higher risk if you are African American.  Gender. Men are at higher risk than women before age 45. After age 65, women are at higher risk than men.  Having obstructive sleep apnea.  Stress. What are the signs or symptoms? High blood pressure may not cause symptoms. Very high blood pressure (hypertensive crisis) may cause:  Headache.  Anxiety.  Shortness of breath.  Nosebleed.  Nausea and vomiting.  Vision changes.  Severe chest pain.  Seizures. How is this diagnosed? This condition is diagnosed by measuring your blood pressure while you are seated, with your arm resting on a flat surface, your legs uncrossed, and your feet flat on the floor. The cuff of the blood pressure monitor will be placed directly against the skin of your upper arm at the level of your   heart. It should be measured at least twice using the same arm. Certain conditions can cause a difference in blood pressure between your right and left arms. Certain factors can cause blood pressure readings to be lower or higher than normal for a short period of time:  When your blood pressure is higher when you are in a health care provider's office than when you are at home, this is called white coat hypertension.  Most people with this condition do not need medicines.  When your blood pressure is higher at home than when you are in a health care provider's office, this is called masked hypertension. Most people with this condition may need medicines to control blood pressure. If you have a high blood pressure reading during one visit or you have normal blood pressure with other risk factors, you may be asked to:  Return on a different day to have your blood pressure checked again.  Monitor your blood pressure at home for 1 week or longer. If you are diagnosed with hypertension, you may have other blood or imaging tests to help your health care provider understand your overall risk for other conditions. How is this treated? This condition is treated by making healthy lifestyle changes, such as eating healthy foods, exercising more, and reducing your alcohol intake. Your health care provider may prescribe medicine if lifestyle changes are not enough to get your blood pressure under control, and if:  Your systolic blood pressure is above 130.  Your diastolic blood pressure is above 80. Your personal target blood pressure may vary depending on your medical conditions, your age, and other factors. Follow these instructions at home: Eating and drinking   Eat a diet that is high in fiber and potassium, and low in sodium, added sugar, and fat. An example eating plan is called the DASH (Dietary Approaches to Stop Hypertension) diet. To eat this way: ? Eat plenty of fresh fruits and vegetables. Try to fill one half of your plate at each meal with fruits and vegetables. ? Eat whole grains, such as whole-wheat pasta, brown rice, or whole-grain bread. Fill about one fourth of your plate with whole grains. ? Eat or drink low-fat dairy products, such as skim milk or low-fat yogurt. ? Avoid fatty cuts of meat, processed or cured meats, and poultry with skin. Fill about one fourth of your plate with lean proteins, such  as fish, chicken without skin, beans, eggs, or tofu. ? Avoid pre-made and processed foods. These tend to be higher in sodium, added sugar, and fat.  Reduce your daily sodium intake. Most people with hypertension should eat less than 1,500 mg of sodium a day.  Do not drink alcohol if: ? Your health care provider tells you not to drink. ? You are pregnant, may be pregnant, or are planning to become pregnant.  If you drink alcohol: ? Limit how much you use to:  0-1 drink a day for women.  0-2 drinks a day for men. ? Be aware of how much alcohol is in your drink. In the [place] one drink equals one 12 oz bottle of beer (355 mL), one 5 oz glass of wine (148 mL), or one 1 oz glass of hard liquor (44 mL). Lifestyle   Work with your health care provider to maintain a healthy body weight or to lose weight. Ask what an ideal weight is for you.  Get at least 30 minutes of exercise most days of the week. Activities may include walking, swimming,   or biking.  Include exercise to strengthen your muscles (resistance exercise), such as Pilates or lifting weights, as part of your weekly exercise routine. Try to do these types of exercises for 30 minutes at least 3 days a week.  Do not use any products that contain nicotine or tobacco, such as cigarettes, e-cigarettes, and chewing tobacco. If you need help quitting, ask your health care provider.  Monitor your blood pressure at home as told by your health care provider.  Keep all follow-up visits as told by your health care provider. This is important. Medicines  Take over-the-counter and prescription medicines only as told by your health care provider. Follow directions carefully. Blood pressure medicines must be taken as prescribed.  Do not skip doses of blood pressure medicine. Doing this puts you at risk for problems and can make the medicine less effective.  Ask your health care provider about side effects or reactions to medicines that you  should watch for. Contact a health care provider if you:  Think you are having a reaction to a medicine you are taking.  Have headaches that keep coming back (recurring).  Feel dizzy.  Have swelling in your ankles.  Have trouble with your vision. Get help right away if you:  Develop a severe headache or confusion.  Have unusual weakness or numbness.  Feel faint.  Have severe pain in your chest or abdomen.  Vomit repeatedly.  Have trouble breathing. Summary  Hypertension is when the force of blood pumping through your arteries is too strong. If this condition is not controlled, it may put you at risk for serious complications.  Your personal target blood pressure may vary depending on your medical conditions, your age, and other factors. For most people, a normal blood pressure is less than 120/80.  Hypertension is treated with lifestyle changes, medicines, or a combination of both. Lifestyle changes include losing weight, eating a healthy, low-sodium diet, exercising more, and limiting alcohol. This information is not intended to replace advice given to you by your health care provider. Make sure you discuss any questions you have with your health care provider. Document Revised: 05/20/2018 Document Reviewed: 05/20/2018 [employer] Patient Education  2020 [employer]  

## 2019-11-08 NOTE — Progress Notes (Signed)
Dana Reese 54 y.o.   Chief Complaint  Patient presents with  . Medication Refill    Lisinopril-Hctz    HISTORY OF PRESENT ILLNESS: This is a 54 y.o. female with history of hypertension here for follow-up and medication refill. BP Readings from Last 3 Encounters:  11/08/19 120/80  08/14/18 (!) 163/95  12/05/17 (!) 158/90  Has no complaints or medical concerns today.  HPI   Prior to Admission medications   Medication Sig Start Date End Date Taking? Authorizing Provider  lisinopril-hydrochlorothiazide (ZESTORETIC) 20-12.5 MG tablet Take 1 tablet by mouth daily. MUST HAVE DOCTORS VISIT FOR ADDITIONAL REFILLS 10/18/19  Yes Li Fragoso, Eilleen Kempf, MD  hydrochlorothiazide (HYDRODIURIL) 25 MG tablet TAKE 1 TABLET BY MOUTH ONCE DAILY ( PATIENT NEEDS OFFICE VISIT WITH NEW PROVIDER FOR FUTURE REFILLS) Patient not taking: Reported on 11/08/2019 07/21/18   Doristine Bosworth, MD  lisinopril (PRINIVIL,ZESTRIL) 10 MG tablet TAKE 1 TABLET BY MOUTH ONCE DAILY **PATIENT WILL NEED TO CALL TO SCHEDULE OFFICE VISIT WITH NEW PROVIDER FOR FUTURE REFILLS** Patient not taking: Reported on 11/08/2019 07/21/18   Doristine Bosworth, MD    No Known Allergies  Patient Active Problem List   Diagnosis Date Noted  . Degenerative disc disease, cervical 11/16/2017  . 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 LGSIL on Pap smear of cervix 09/06/2016  . Abnormal Papanicolaou smear of cervix with positive human papilloma virus (HPV) test 10/04/2015  . Perimenopause 10/04/2015  . Macromastia 07/14/2015  . Essential hypertension, benign 10/24/2012  . Family history of colon cancer 10/24/2012  . Family history of breast cancer in first degree relative 10/24/2012    Past Medical History:  Diagnosis Date  . Allergy   . ASCUS with positive high risk HPV 05/25/2011  . Chlamydia 09/23/2010  . Hyperlipidemia   . Hypertension 09/23/2005  . Symptomatic mammary hypertrophy   . VAIN II (vaginal  intraepithelial neoplasia grade II) 10/2016    Past Surgical History:  Procedure Laterality Date  . Colonoscopy  06/23/2012   normal; repeat in 10 years; [facility].  Marland Kitchen COLONOSCOPY    . TUBAL LIGATION      Social History   Socioeconomic History  . Marital status: Single    Spouse name: Not on file  . Number of children: Not on file  . Years of education: Not on file  . Highest education level: Not on file  Occupational History  . Not on file  Tobacco Use  . Smoking status: Never Smoker  . Smokeless tobacco: Never Used  Substance and Sexual Activity  . Alcohol use: No    Alcohol/week: 0.0 standard drinks  . Drug use: No  . Sexual activity: Yes    Partners: Male    Birth control/protection: Surgical  Other Topics Concern  . 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      Tobacco: none       Alcohol: none      Drugs: none      Exercise:  None in 2018      Sexually activity: yes; history of Chlamydia 2012.        Seatbelt: 100% of time.  Guns: none   Social Determinants of Corporate investment banker Strain:   . Difficulty of Paying Living Expenses: Not on file  Food Insecurity:   . Worried About Programme researcher, broadcasting/film/video in the Last Year: Not on file  . Ran Out of Food in the Last Year: Not on file  Transportation Needs:   . Lack of Transportation (Medical): Not on file  . Lack of Transportation (Non-Medical): Not on file  Physical Activity:   . Days of Exercise per Week: Not on file  . Minutes of Exercise per Session: Not on file  Stress:   . Feeling of Stress : Not on file  Social Connections:   . Frequency of Communication with Friends and Family: Not on file  . Frequency of Social Gatherings with Friends and Family: Not  on file  . Attends Religious Services: Not on file  . Active Member of Clubs or Organizations: Not on file  . Attends Banker Meetings: Not on file  . Marital Status: Not on file  Intimate Partner Violence:   . Fear of Current or Ex-Partner: Not on file  . Emotionally Abused: Not on file  . Physically Abused: Not on file  . Sexually Abused: Not on file    Family History  Problem Relation Age of Onset  . Colon cancer Mother 69       colon cancer  . Cancer Mother        Breast cancer age 40; colon cancer age 62; bladder cancer age 54.  Marland Kitchen Hypertension Mother   . Hyperlipidemia Mother   . Breast cancer Mother 65       Breast cancer  . Cancer Maternal Grandmother        COLON  . Heart disease Maternal Grandfather   . Cancer Father 39       colon cancer.  . Diabetes Daughter   . Esophageal cancer Neg Hx   . Liver cancer Neg Hx   . Pancreatic cancer Neg Hx   . Rectal cancer Neg Hx   . Stomach cancer Neg Hx      Review of Systems  Constitutional: Negative.  Negative for chills and fever.  HENT: Negative.  Negative for congestion and sore throat.   Respiratory: Negative.  Negative for cough and shortness of breath.   Cardiovascular: Negative.  Negative for chest pain and palpitations.  Gastrointestinal: Negative for abdominal pain, diarrhea, nausea and vomiting.  Genitourinary: Negative.  Negative for dysuria and hematuria.  Musculoskeletal: Negative.  Negative for back pain and myalgias.  Skin: Negative.  Negative for rash.  Neurological: Negative.  Negative for dizziness and headaches.  Endo/Heme/Allergies: Negative.   All other systems reviewed and are negative.  Today's Vitals   11/08/19 1511  BP: 120/80  Pulse: 88  Resp: 16  Temp: (!) 97.2 F (36.2 C)  TempSrc: Temporal  SpO2: 98%  Weight: 240 lb (108.9 kg)  Height: 5' 5.5" (1.664 m)   Body mass index is 39.33 kg/m.   Physical Exam Vitals reviewed.  Constitutional:      Appearance: Normal  appearance.  HENT:     Head: Normocephalic.  Eyes:     Extraocular Movements: Extraocular movements intact.     Pupils: Pupils are equal, round, and reactive to light.  Cardiovascular:     Rate and Rhythm: Normal rate and regular rhythm.     Pulses: Normal pulses.     Heart sounds: Normal heart sounds.  Pulmonary:  Effort: Pulmonary effort is normal.     Breath sounds: Normal breath sounds.  Musculoskeletal:        General: Normal range of motion.     Cervical back: Normal range of motion and neck supple.  Skin:    General: Skin is warm and dry.     Capillary Refill: Capillary refill takes less than 2 seconds.  Neurological:     General: No focal deficit present.     Mental Status: She is alert and oriented to person, place, and time.  Psychiatric:        Mood and Affect: Mood normal.        Behavior: Behavior normal.      ASSESSMENT & PLAN: Clinically stable.  No medical concerns identified during this visit. Dana Reese was seen today for medication refill.  Diagnoses and all orders for this visit:  Essential hypertension, benign -     lisinopril-hydrochlorothiazide (ZESTORETIC) 20-12.5 MG tablet; Take 1 tablet by mouth daily. -     Comprehensive metabolic panel -     CBC with Differential/Platelet -     Lipid panel  Screening for colon cancer -     Cologuard    Patient Instructions       If you have lab work done today you will be contacted with your lab results within the next 2 weeks.  If you have not heard from [place] then please contact us. The fastest way to get your results is to register for My Chart.   IF you received an x-ray today, you will receive an invoice from [facility]. Please contact [facility] at [phone] with questions or concerns regarding your invoice.   IF you received labwork today, you will receive an invoice from [place]. Please contact [facility] at [phone] with questions or concerns regarding your invoice.    Our billing staff will not be able to assist you with questions regarding bills from these companies.  You will be contacted with the lab results as soon as they are available. The fastest way to get your results is to activate your My Chart account. Instructions are located on the last page of this paperwork. If you have not heard from [place] regarding the results in 2 weeks, please contact this office.     Hypertension, Adult High blood pressure (hypertension) is when the force of blood pumping through the arteries is too strong. The arteries are the blood vessels that carry blood from the heart throughout the body. Hypertension forces the heart to work harder to pump blood and may cause arteries to become narrow or stiff. Untreated or uncontrolled hypertension can cause a heart attack, heart failure, a stroke, kidney disease, and other problems. A blood pressure reading consists of a higher number over a lower number. Ideally, your blood pressure should be below 120/80. The first ("top") number is called the systolic pressure. It is a measure of the pressure in your arteries as your heart beats. The second ("bottom") number is called the diastolic pressure. It is a measure of the pressure in your arteries as the heart relaxes. What are the causes? The exact cause of this condition is not known. There are some conditions that result in or are related to high blood pressure. What increases the risk? Some risk factors for high blood pressure are under your control. The following factors may make you more likely to develop this condition:  Smoking.  Having type 2 diabetes mellitus, high cholesterol, or both.  Not  getting enough exercise or physical activity.  Being overweight.  Having too much fat, sugar, calories, or salt (sodium) in your diet.  Drinking too much alcohol. Some risk factors for high blood pressure may be difficult or impossible to change. Some of these factors  include:  Having chronic kidney disease.  Having a family history of high blood pressure.  Age. Risk increases with age.  Race. You may be at higher risk if you are African American.  Gender. Men are at higher risk than women before age 58. After age 70, women are at higher risk than men.  Having obstructive sleep apnea.  Stress. What are the signs or symptoms? High blood pressure may not cause symptoms. Very high blood pressure (hypertensive crisis) may cause:  Headache.  Anxiety.  Shortness of breath.  Nosebleed.  Nausea and vomiting.  Vision changes.  Severe chest pain.  Seizures. How is this diagnosed? This condition is diagnosed by measuring your blood pressure while you are seated, with your arm resting on a flat surface, your legs uncrossed, and your feet flat on the floor. The cuff of the blood pressure monitor will be placed directly against the skin of your upper arm at the level of your heart. It should be measured at least twice using the same arm. Certain conditions can cause a difference in blood pressure between your right and left arms. Certain factors can cause blood pressure readings to be lower or higher than normal for a short period of time:  When your blood pressure is higher when you are in a health care provider's office than when you are at home, this is called white coat hypertension. Most people with this condition do not need medicines.  When your blood pressure is higher at home than when you are in a health care provider's office, this is called masked hypertension. Most people with this condition may need medicines to control blood pressure. If you have a high blood pressure reading during one visit or you have normal blood pressure with other risk factors, you may be asked to:  Return on a different day to have your blood pressure checked again.  Monitor your blood pressure at home for 1 week or longer. If you are diagnosed with  hypertension, you may have other blood or imaging tests to help your health care provider understand your overall risk for other conditions. How is this treated? This condition is treated by making healthy lifestyle changes, such as eating healthy foods, exercising more, and reducing your alcohol intake. Your health care provider may prescribe medicine if lifestyle changes are not enough to get your blood pressure under control, and if:  Your systolic blood pressure is above 130.  Your diastolic blood pressure is above 80. Your personal target blood pressure may vary depending on your medical conditions, your age, and other factors. Follow these instructions at home: Eating and drinking   Eat a diet that is high in fiber and potassium, and low in sodium, added sugar, and fat. An example eating plan is called the DASH (Dietary Approaches to Stop Hypertension) diet. To eat this way: ? Eat plenty of fresh fruits and vegetables. Try to fill one half of your plate at each meal with fruits and vegetables. ? Eat whole grains, such as whole-wheat pasta, brown rice, or whole-grain bread. Fill about one fourth of your plate with whole grains. ? Eat or drink low-fat dairy products, such as skim milk or low-fat yogurt. ? Avoid fatty cuts  of meat, processed or cured meats, and poultry with skin. Fill about one fourth of your plate with lean proteins, such as fish, chicken without skin, beans, eggs, or tofu. ? Avoid pre-made and processed foods. These tend to be higher in sodium, added sugar, and fat.  Reduce your daily sodium intake. Most people with hypertension should eat less than 1,500 mg of sodium a day.  Do not drink alcohol if: ? Your health care provider tells you not to drink. ? You are pregnant, may be pregnant, or are planning to become pregnant.  If you drink alcohol: ? Limit how much you use to:  0-1 drink a day for women.  0-2 drinks a day for men. ? Be aware of how much alcohol is in  your drink. In the [place] one drink equals one 12 oz bottle of beer (355 mL), one 5 oz glass of wine (148 mL), or one 1 oz glass of hard liquor (44 mL). Lifestyle   Work with your health care provider to maintain a healthy body weight or to lose weight. Ask what an ideal weight is for you.  Get at least 30 minutes of exercise most days of the week. Activities may include walking, swimming, or biking.  Include exercise to strengthen your muscles (resistance exercise), such as Pilates or lifting weights, as part of your weekly exercise routine. Try to do these types of exercises for 30 minutes at least 3 days a week.  Do not use any products that contain nicotine or tobacco, such as cigarettes, e-cigarettes, and chewing tobacco. If you need help quitting, ask your health care provider.  Monitor your blood pressure at home as told by your health care provider.  Keep all follow-up visits as told by your health care provider. This is important. Medicines  Take over-the-counter and prescription medicines only as told by your health care provider. Follow directions carefully. Blood pressure medicines must be taken as prescribed.  Do not skip doses of blood pressure medicine. Doing this puts you at risk for problems and can make the medicine less effective.  Ask your health care provider about side effects or reactions to medicines that you should watch for. Contact a health care provider if you:  Think you are having a reaction to a medicine you are taking.  Have headaches that keep coming back (recurring).  Feel dizzy.  Have swelling in your ankles.  Have trouble with your vision. Get help right away if you:  Develop a severe headache or confusion.  Have unusual weakness or numbness.  Feel faint.  Have severe pain in your chest or abdomen.  Vomit repeatedly.  Have trouble breathing. Summary  Hypertension is when the force of blood pumping through your arteries is too strong.  If this condition is not controlled, it may put you at risk for serious complications.  Your personal target blood pressure may vary depending on your medical conditions, your age, and other factors. For most people, a normal blood pressure is less than 120/80.  Hypertension is treated with lifestyle changes, medicines, or a combination of both. Lifestyle changes include losing weight, eating a healthy, low-sodium diet, exercising more, and limiting alcohol. This information is not intended to replace advice given to you by your health care provider. Make sure you discuss any questions you have with your health care provider. Document Revised: 05/20/2018 Document Reviewed: 05/20/2018 Elsevier Patient Education  2020 Elsevier Inc.      Agustina Caroli, MD [facility]

## 2019-11-09 ENCOUNTER — Other Ambulatory Visit: Payer: Self-pay | Admitting: Emergency Medicine

## 2019-11-09 DIAGNOSIS — E785 Hyperlipidemia, unspecified: Secondary | ICD-10-CM

## 2019-11-09 LAB — CBC WITH DIFFERENTIAL/PLATELET
Basophils Absolute: 0 10*3/uL (ref 0.0–0.2)
Basos: 0 %
EOS (ABSOLUTE): 0.1 10*3/uL (ref 0.0–0.4)
Eos: 1 %
Hematocrit: 36.6 % (ref 34.0–46.6)
Hemoglobin: 12.9 g/dL (ref 11.1–15.9)
Immature Grans (Abs): 0 10*3/uL (ref 0.0–0.1)
Immature Granulocytes: 0 %
Lymphocytes Absolute: 1.9 10*3/uL (ref 0.7–3.1)
Lymphs: 28 %
MCH: 31.3 pg (ref 26.6–33.0)
MCHC: 35.2 g/dL (ref 31.5–35.7)
MCV: 89 fL (ref 79–97)
Monocytes Absolute: 0.3 10*3/uL (ref 0.1–0.9)
Monocytes: 5 %
Neutrophils Absolute: 4.6 10*3/uL (ref 1.4–7.0)
Neutrophils: 66 %
Platelets: 290 10*3/uL (ref 150–450)
RBC: 4.12 x10E6/uL (ref 3.77–5.28)
RDW: 14.1 % (ref 11.7–15.4)
WBC: 7 10*3/uL (ref 3.4–10.8)

## 2019-11-09 LAB — COMPREHENSIVE METABOLIC PANEL
ALT: 8 IU/L (ref 0–32)
AST: 16 IU/L (ref 0–40)
Albumin/Globulin Ratio: 1.4 (ref 1.2–2.2)
Albumin: 4.3 g/dL (ref 3.8–4.9)
Alkaline Phosphatase: 87 IU/L (ref 39–117)
BUN/Creatinine Ratio: 13 (ref 9–23)
BUN: 10 mg/dL (ref 6–24)
Bilirubin Total: 0.3 mg/dL (ref 0.0–1.2)
CO2: 23 mmol/L (ref 20–29)
Calcium: 9.7 mg/dL (ref 8.7–10.2)
Chloride: 104 mmol/L (ref 96–106)
Creatinine, Ser: 0.75 mg/dL (ref 0.57–1.00)
GFR calc Af Amer: 105 mL/min/{1.73_m2} (ref >59)
GFR calc non Af Amer: 91 mL/min/{1.73_m2} (ref >59)
Globulin, Total: 3.1 g/dL (ref 1.5–4.5)
Glucose: 92 mg/dL (ref 65–99)
Potassium: 4 mmol/L (ref 3.5–5.2)
Sodium: 141 mmol/L (ref 134–144)
Total Protein: 7.4 g/dL (ref 6.0–8.5)

## 2019-11-09 LAB — LIPID PANEL
Chol/HDL Ratio: 4.8 ratio — ABNORMAL HIGH (ref 0.0–4.4)
Cholesterol, Total: 258 mg/dL — ABNORMAL HIGH (ref 100–199)
HDL: 54 mg/dL (ref >39)
LDL Chol Calc (NIH): 177 mg/dL — ABNORMAL HIGH (ref 0–99)
Triglycerides: 146 mg/dL (ref 0–149)
VLDL Cholesterol Cal: 27 mg/dL (ref 5–40)

## 2019-11-09 MED ORDER — ROSUVASTATIN CALCIUM 10 MG PO TABS: 10.0000 mg | ORAL_TABLET | Freq: Every day | ORAL | 3 refills | Status: DC

## 2019-11-09 NOTE — Progress Notes (Signed)
The 10-year ASCVD risk score Denman George DC Montez Hageman., et al., 2013) is: 4.7%   Values used to calculate the score:     Age: 54 years     Sex: Female     Is Non-Hispanic African American: Yes     Diabetic: No     Tobacco smoker: No     Systolic Blood Pressure: 120 mmHg     Is BP treated: Yes     HDL Cholesterol: 54 mg/dL     Total Cholesterol: 258 mg/dL Crestor

## 2019-11-18 ENCOUNTER — Telehealth: Payer: Self-pay | Admitting: *Deleted

## 2019-11-18 NOTE — Telephone Encounter (Signed)
Faxed Cologuard requisition to [facility]. Confirmation page received and given to PCP lab to file in binder.

## 2019-12-10 IMAGING — DX DG SHOULDER 2+V*R*
3 series · 3 of 3 positions shown · non-contrast
Comparison: None.

CLINICAL DATA: R shoulder, scapular pain for two months -[facility]-

EXAM:
RIGHT SHOULDER - 2+ VIEW

[shoulder ap]
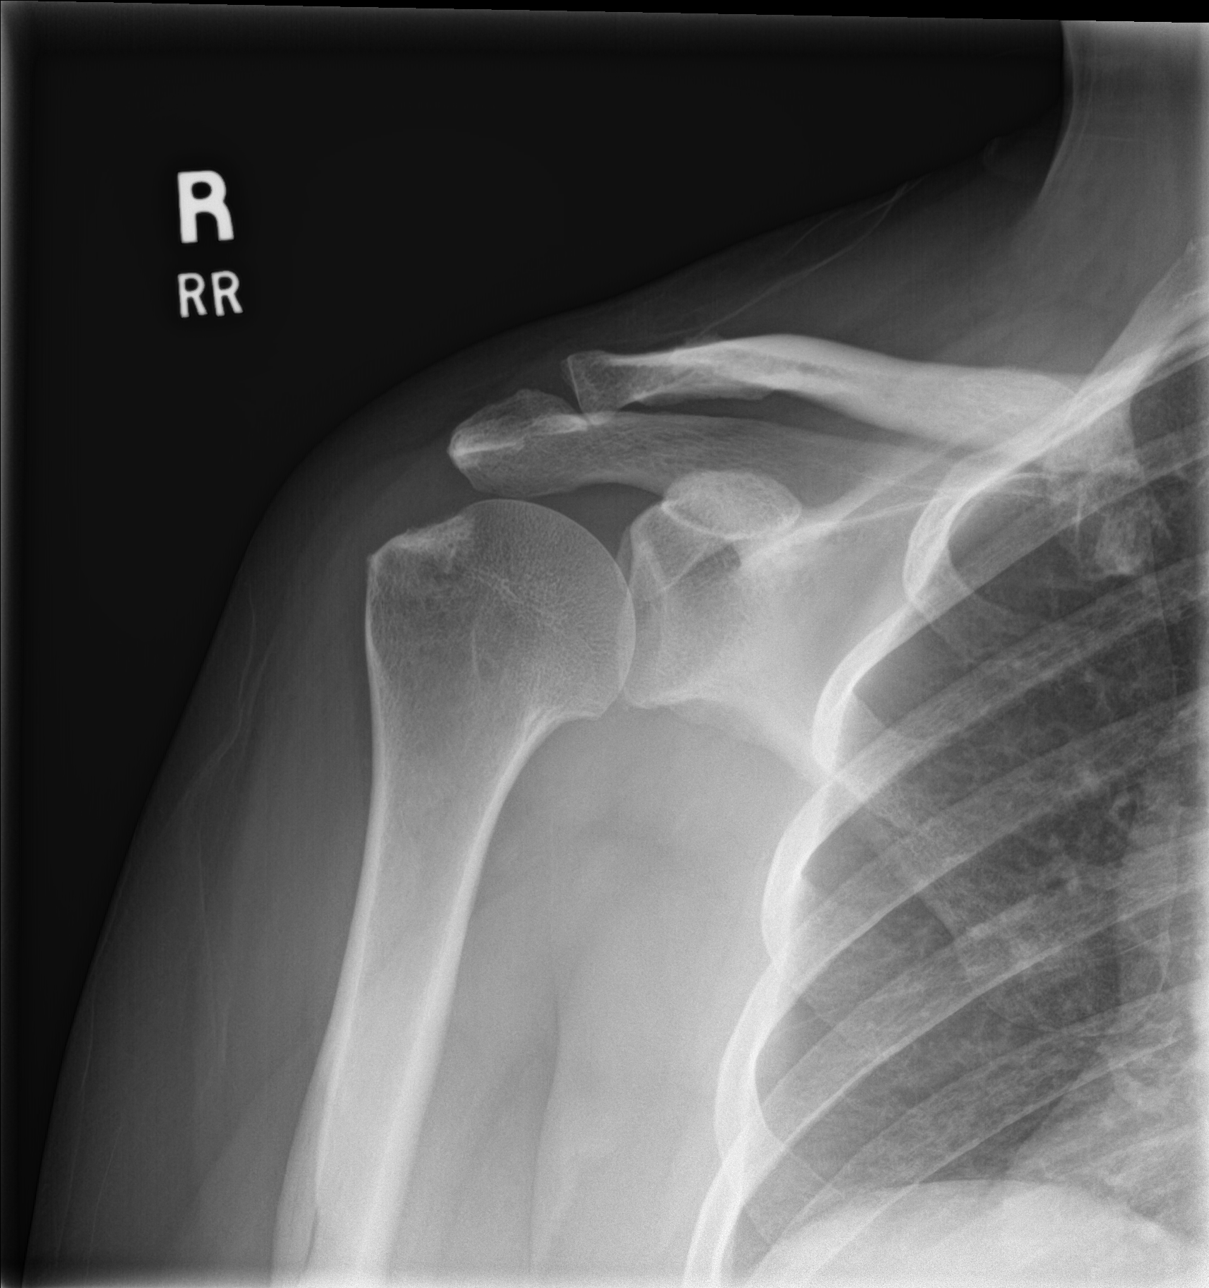

[shoulder y-view]
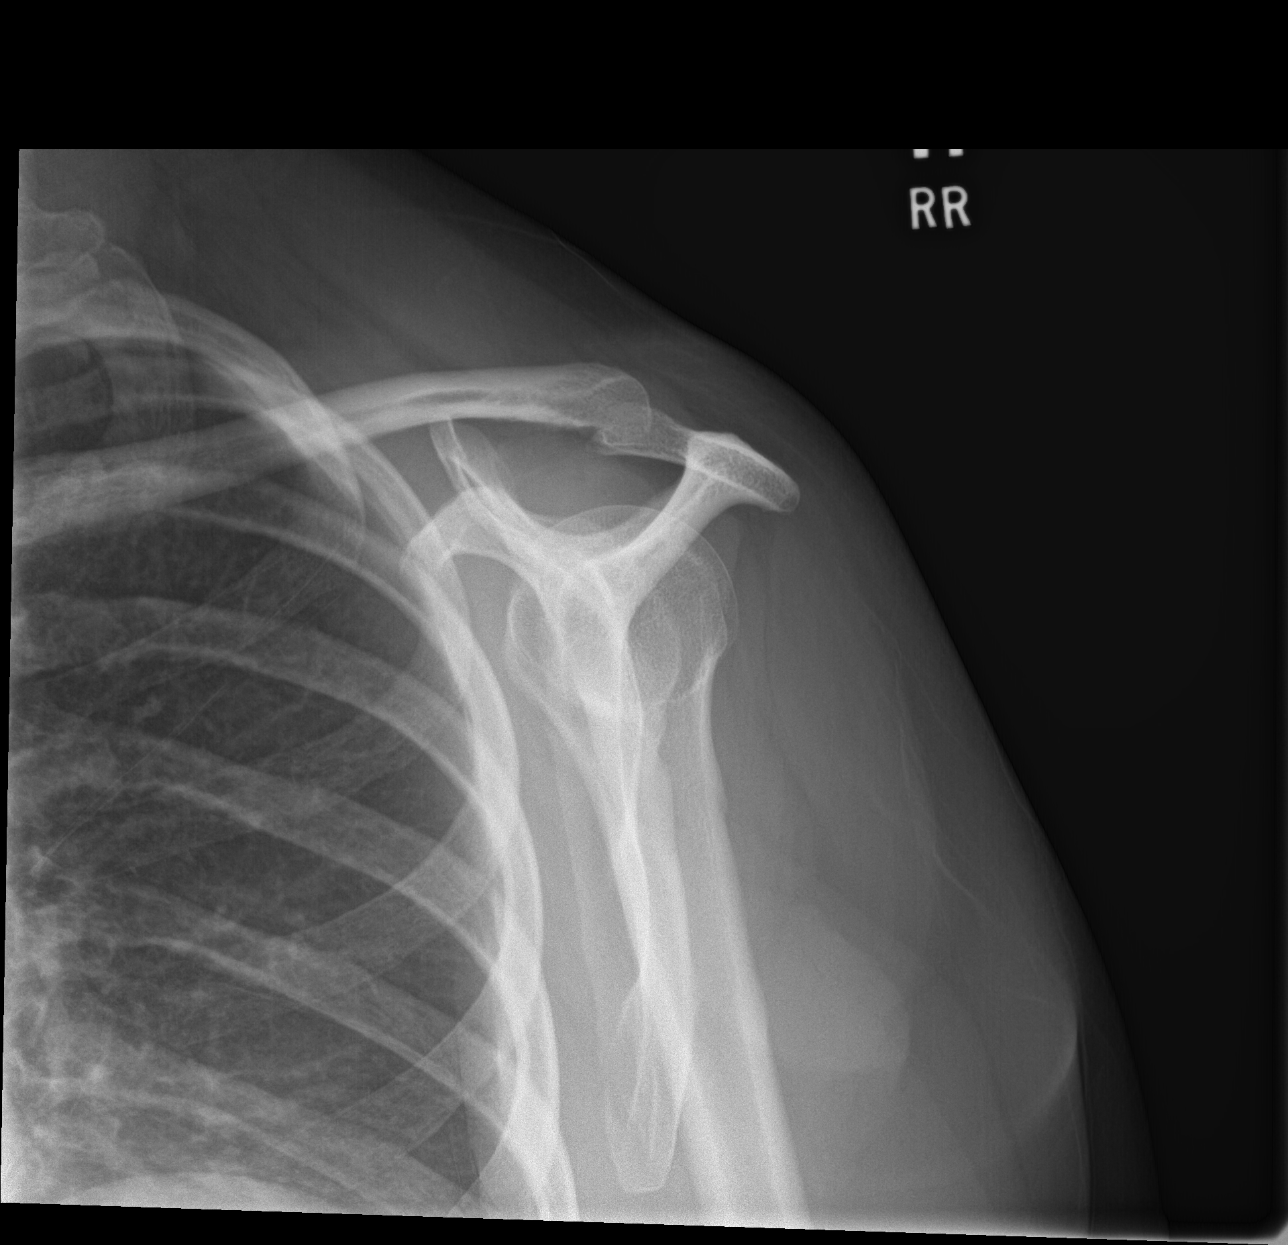

[shoulder axial]
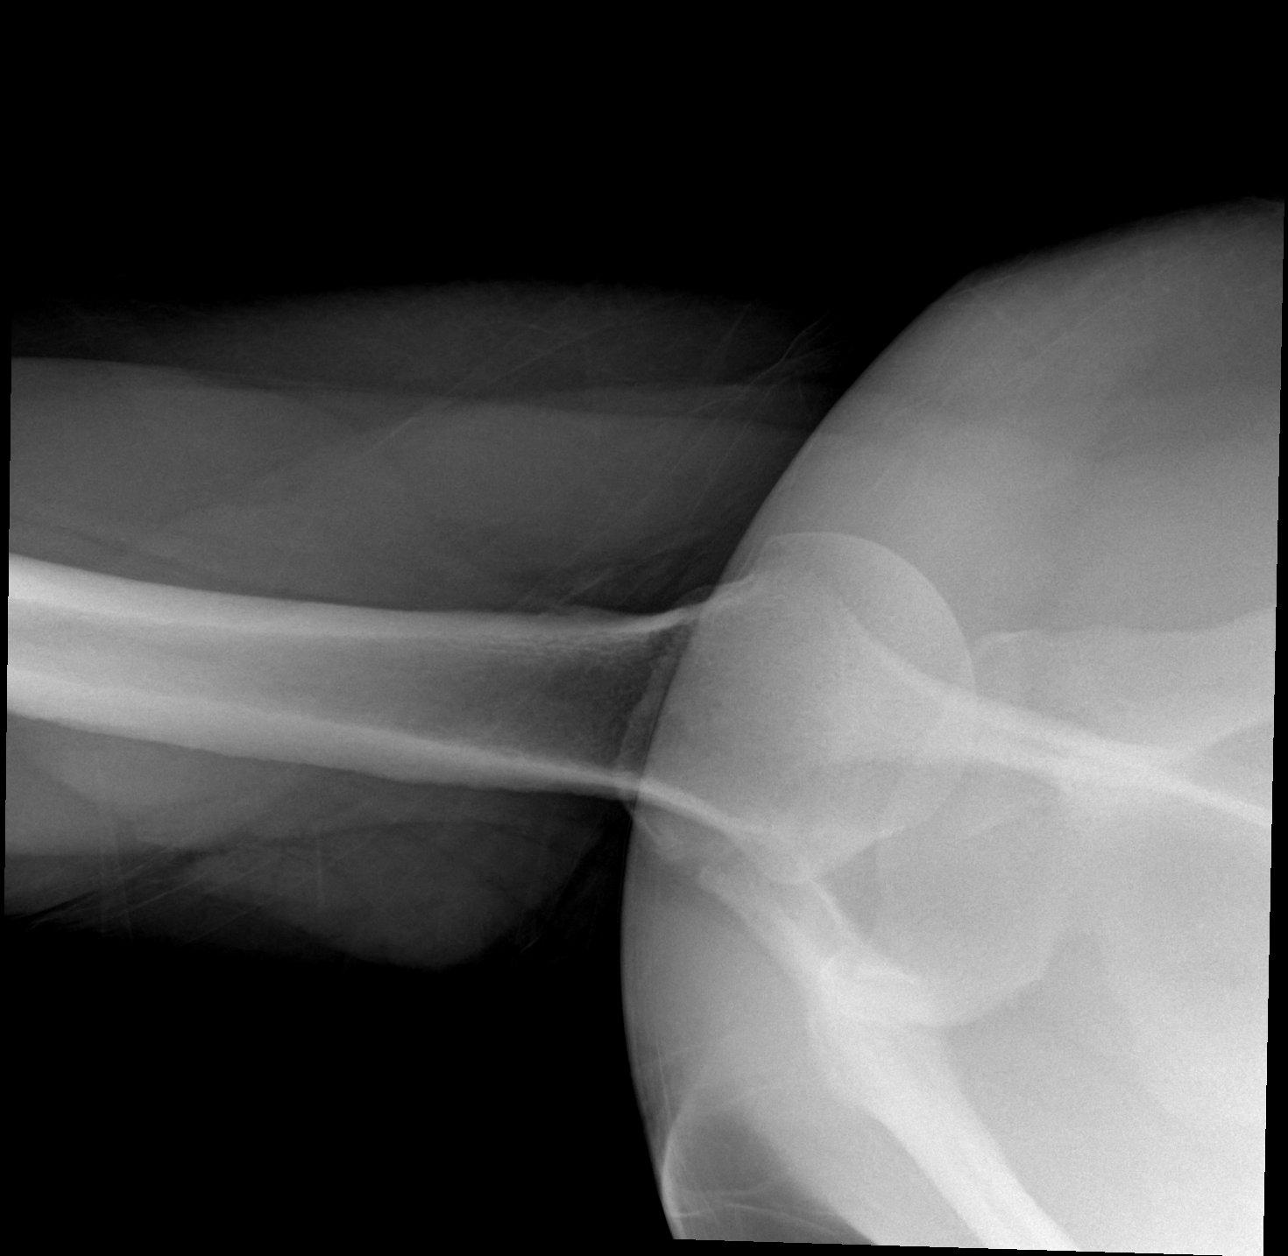

[3 of 3 positions shown; findings below may reference images not displayed]

FINDINGS: There is no evidence of fracture or dislocation. There is no
evidence of arthropathy or other focal bone abnormality. Soft
tissues are unremarkable.
IMPRESSION: Negative.

## 2019-12-15 LAB — COLOGUARD: Cologuard: NEGATIVE

## 2019-12-16 NOTE — Progress Notes (Signed)
Spoke with pt, all is well

## 2019-12-20 ENCOUNTER — Ambulatory Visit: Payer: Managed Care, Other (non HMO) | Attending: Internal Medicine

## 2019-12-20 DIAGNOSIS — Z23 Encounter for immunization: Secondary | ICD-10-CM

## 2019-12-20 NOTE — Progress Notes (Signed)
   Covid-19 Vaccination Clinic  Name:  Dana Reese    MRN: [id number] DOB: 1966/08/14  12/20/2019  Dana Reese was observed post Covid-19 immunization for 15 minutes without incident. She was provided with Vaccine Information Sheet and instruction to access the V-Safe system.   Dana Reese was instructed to call 911 with any severe reactions post vaccine: Marland Kitchen Difficulty breathing  . Swelling of face and throat  . A fast heartbeat  . A bad rash all over body  . Dizziness and weakness   Immunizations Administered    Name Date Dose VIS Date Route   Pfizer COVID-19 Vaccine 12/20/2019  3:51 PM 0.3 mL 09/03/2019 Intramuscular   Manufacturer: [employer], [employer]   Lot: [id number]   NDC: [address]-7

## 2020-01-17 ENCOUNTER — Ambulatory Visit: Payer: Managed Care, Other (non HMO) | Attending: Internal Medicine

## 2020-01-17 DIAGNOSIS — Z23 Encounter for immunization: Secondary | ICD-10-CM

## 2020-01-17 NOTE — Progress Notes (Signed)
   Covid-19 Vaccination Clinic  Name:  ANIQUA BRIERE    MRN: [id number] DOB: [birth year]  01/17/2020  Ms. Roback was observed post Covid-19 immunization for 15 minutes without incident. She was provided with Vaccine Information Sheet and instruction to access the V-Safe system.   Ms. Nofsinger was instructed to call 911 with any severe reactions post vaccine: Marland Kitchen Difficulty breathing  . Swelling of face and throat  . A fast heartbeat  . A bad rash all over body  . Dizziness and weakness   Immunizations Administered    Name Date Dose VIS Date Route   Pfizer COVID-19 Vaccine 01/17/2020  3:42 PM 0.3 mL 11/17/2018 Intramuscular   Manufacturer: [employer], [employer]   Lot: [id number]   NDC: [address]-8

## 2020-05-08 ENCOUNTER — Ambulatory Visit: Payer: Managed Care, Other (non HMO) | Admitting: Emergency Medicine

## 2020-05-22 ENCOUNTER — Telehealth: Payer: Self-pay | Admitting: Emergency Medicine

## 2020-05-22 DIAGNOSIS — E785 Hyperlipidemia, unspecified: Secondary | ICD-10-CM

## 2020-05-22 NOTE — Telephone Encounter (Signed)
I have ordered some Lab for pts CPE coming up on 06/16/20 are these labs okay or would you want to add anything else to the order.

## 2020-05-22 NOTE — Telephone Encounter (Signed)
Pt has cpe sch for 9/24 needing labs in. Please advise.

## 2020-06-16 ENCOUNTER — Encounter: Payer: Self-pay | Admitting: Family Medicine

## 2020-06-16 ENCOUNTER — Other Ambulatory Visit (HOSPITAL_COMMUNITY)
Admission: RE | Admit: 2020-06-16 | Discharge: 2020-06-16 | Disposition: A | Discharge status: Home / Self Care | Payer: Managed Care, Other (non HMO) | Source: Ambulatory Visit | Attending: Family Medicine | Admitting: Family Medicine

## 2020-06-16 ENCOUNTER — Other Ambulatory Visit: Payer: Self-pay

## 2020-06-16 ENCOUNTER — Ambulatory Visit (INDEPENDENT_AMBULATORY_CARE_PROVIDER_SITE_OTHER): Payer: Managed Care, Other (non HMO) | Admitting: Family Medicine

## 2020-06-16 VITALS — BP 122/81 | HR 89 | Temp 97.6°F | Resp 15 | Ht 65.75 in | Wt 231.0 lb

## 2020-06-16 DIAGNOSIS — R87612 Low grade squamous intraepithelial lesion on cytologic smear of cervix (LGSIL): Secondary | ICD-10-CM | POA: Diagnosis not present

## 2020-06-16 DIAGNOSIS — R87619 Unspecified abnormal cytological findings in specimens from cervix uteri: Secondary | ICD-10-CM | POA: Diagnosis not present

## 2020-06-16 DIAGNOSIS — I10 Essential (primary) hypertension: Secondary | ICD-10-CM | POA: Diagnosis not present

## 2020-06-16 DIAGNOSIS — Z0001 Encounter for general adult medical examination with abnormal findings: Secondary | ICD-10-CM

## 2020-06-16 DIAGNOSIS — Z0000 Encounter for general adult medical examination without abnormal findings: Secondary | ICD-10-CM

## 2020-06-16 MED ORDER — LISINOPRIL-HYDROCHLOROTHIAZIDE 20-12.5 MG PO TABS: 1.0000 | ORAL_TABLET | Freq: Every day | ORAL | 3 refills | Status: DC

## 2020-06-16 NOTE — Progress Notes (Signed)
Patient ID: Dana Reese, female    DOB: 04-06-1966  Age: 54 y.o. MRN: [id number]  Chief Complaint  Patient presents with  . Annual Exam    pt feeling well today no concerns     Subjective:  Annual physical examination  Patient is here for annual physical exam.  She has no major acute complaints.  She never did take her lipid-lowering medication because she thought she could do it with dietary exercise and weight loss.  Past medical  Operations: Bilateral tubal ligation Breast reduction surgery Gravida 2 para 2 Medical illnesses: None Immunizations COVID-19 positive vaccine finished in 2023/02/02  Family history: Father died at 66 of colon cancer Mother died at 48 of colon cancer.  Mother also had breast cancer.  Maternal grandmother had colon cancer.  No other major illnesses in the family  Social history:  patient is single.  Has a daughter and grandchild who live with her.  She has a second daughter lives in [place].  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  She does go to the gym.  Review of systems Constitutional: Unremarkable.  General energy is good HEENT: Wears glasses.  Is very nearsighted.  Otherwise everything is normal.  Goes to the dentist regularly though she was missing a lot of molars. Respiratory: Unremarkable Cardiovascular: Unremarkable GI: Unremarkable GU: Unremarkable Mus skeletal: Unremarkable Neurologic: Unremarkable Dermatologic: Unremarkable Psychiatric: Unremarkable Endocrinologic: Unremarkable   Current allergies, medications, problem list, past/family and social histories reviewed.  Objective:  BP 122/81   Pulse 89   Temp 97.6 F (36.4 C) (Temporal)   Resp 15   Ht 5' 5.75" (1.67 m)   Wt 231 lb (104.8 kg)   LMP 04/06/2017 (Approximate)   SpO2 96%   BMI 37.57 kg/m   Overweight but otherwise healthy-appearing lady in no acute distress.  BMI is significantly elevated.  Her TMs are normal.  Eyes PERRL.  EOMs intact.  Throat clear.   Remaining teeth are good.  Neck supple without nodes or thyromegaly.  No carotid bruits.  Chest is clear to auscultation.  Heart rate without murmurs gallops or arrhythmias.  Breasts are symmetrical, full.  Has hypopigmentation of the nipple areas from her reduction mammoplasty scarring.  She has scars circumferentially under the lower aspect of both breast.  Breasts are still fairly large despite having had the reduction mammoplasty.  No axillary nodes.  Abdomen soft that mass or tenderness.  Normal female external genitalia.  Vaginal Koza unremarkable.  Vagina is quite deep and hard to see the cervix for a well.  Pap was taken somewhat blindly.  Bimanual exam is normal the really could not get a good feel of the uterus itself.  Extremities unremarkable.  Skin unremarkable. Assessment & Plan:   Assessment: 1. Annual physical exam   2. Essential hypertension, benign       Plan: See instructions.  Discussed with the patient being physically, emotionally, relationally, and spiritually healthy.  Orders Placed This Encounter  Procedures  . Lipid panel  . Hemoglobin A1c  . Comprehensive metabolic panel  . CBC with Differential/Platelet  . Hepatitis C antibody  . HIV Antibody (routine testing w rflx)    Meds ordered this encounter  Medications  . lisinopril-hydrochlorothiazide (ZESTORETIC) 20-12.5 MG tablet    Sig: Take 1 tablet by mouth daily.    Dispense:  90 tablet    Refill:  3         Patient Instructions   We will get back to  you with the results of your labs.  I will let you know what I think you should be on the cholesterol medicine  Continue your blood pressure medicine.  Seek to get regular exercise  Watch your dietary intake by working on decreasing quantities as we discussed.  Cut out liquid calories and snacks.  If you must snack eat some nuts or fruit.  Return in 6 months or so to follow-up on blood pressure and cholesterol.   If you have lab work done today  you will be contacted with your lab results within the next 2 weeks.  If you have not heard from [place] then please contact us. The fastest way to get your results is to register for My Chart.   IF you received an x-ray today, you will receive an invoice from [facility]. Please contact [facility] at [phone] with questions or concerns regarding your invoice.   IF you received labwork today, you will receive an invoice from [facility]. Please contact [facility] at [phone] with questions or concerns regarding your invoice.   Our billing staff will not be able to assist you with questions regarding bills from these companies.  You will be contacted with the lab results as soon as they are available. The fastest way to get your results is to activate your My Chart account. Instructions are located on the last page of this paperwork. If you have not heard from [place] regarding the results in 2 weeks, please contact this office.        Return in about 6 months (around 12/14/2020), or Dr. Alvy Bimler, for Blood pressure and cholesterol check.   Janace Hoard, MD 06/16/2020

## 2020-06-16 NOTE — Patient Instructions (Addendum)
We will get back to you with the results of your labs.  I will let you know what I think you should be on the cholesterol medicine  Continue your blood pressure medicine.  Seek to get regular exercise  Watch your dietary intake by working on decreasing quantities as we discussed.  Cut out liquid calories and snacks.  If you must snack eat some nuts or fruit.  Return in 6 months or so to follow-up on blood pressure and cholesterol.   If you have lab work done today you will be contacted with your lab results within the next 2 weeks.  If you have not heard from [place] then please contact us. The fastest way to get your results is to register for My Chart.   IF you received an x-ray today, you will receive an invoice from [facility]. Please contact [facility] at [phone] with questions or concerns regarding your invoice.   IF you received labwork today, you will receive an invoice from [place]. Please contact [facility] at [phone] with questions or concerns regarding your invoice.   Our billing staff will not be able to assist you with questions regarding bills from these companies.  You will be contacted with the lab results as soon as they are available. The fastest way to get your results is to activate your My Chart account. Instructions are located on the last page of this paperwork. If you have not heard from [place] regarding the results in 2 weeks, please contact this office.

## 2020-06-17 LAB — COMPREHENSIVE METABOLIC PANEL
ALT: 8 IU/L (ref 0–32)
AST: 15 IU/L (ref 0–40)
Albumin/Globulin Ratio: 1.4 (ref 1.2–2.2)
Albumin: 4.3 g/dL (ref 3.8–4.9)
Alkaline Phosphatase: 83 IU/L (ref 44–121)
BUN/Creatinine Ratio: 11 (ref 9–23)
BUN: 10 mg/dL (ref 6–24)
Bilirubin Total: 0.5 mg/dL (ref 0.0–1.2)
CO2: 23 mmol/L (ref 20–29)
Calcium: 9.8 mg/dL (ref 8.7–10.2)
Chloride: 102 mmol/L (ref 96–106)
Creatinine, Ser: 0.87 mg/dL (ref 0.57–1.00)
GFR calc Af Amer: 87 mL/min/{1.73_m2} (ref >59)
GFR calc non Af Amer: 75 mL/min/{1.73_m2} (ref >59)
Globulin, Total: 3.1 g/dL (ref 1.5–4.5)
Glucose: 89 mg/dL (ref 65–99)
Potassium: 3.9 mmol/L (ref 3.5–5.2)
Sodium: 139 mmol/L (ref 134–144)
Total Protein: 7.4 g/dL (ref 6.0–8.5)

## 2020-06-17 LAB — LIPID PANEL
Chol/HDL Ratio: 5.1 ratio — ABNORMAL HIGH (ref 0.0–4.4)
Cholesterol, Total: 250 mg/dL — ABNORMAL HIGH (ref 100–199)
HDL: 49 mg/dL
LDL Chol Calc (NIH): 174 mg/dL — ABNORMAL HIGH (ref 0–99)
Triglycerides: 150 mg/dL — ABNORMAL HIGH (ref 0–149)
VLDL Cholesterol Cal: 27 mg/dL (ref 5–40)

## 2020-06-17 LAB — CBC WITH DIFFERENTIAL/PLATELET
Basophils Absolute: 0 x10E3/uL (ref 0.0–0.2)
Basos: 1 %
EOS (ABSOLUTE): 0.1 x10E3/uL (ref 0.0–0.4)
Eos: 2 %
Hematocrit: 36.4 % (ref 34.0–46.6)
Hemoglobin: 12.3 g/dL (ref 11.1–15.9)
Immature Grans (Abs): 0 x10E3/uL (ref 0.0–0.1)
Immature Granulocytes: 0 %
Lymphocytes Absolute: 1.5 x10E3/uL (ref 0.7–3.1)
Lymphs: 27 %
MCH: 30.6 pg (ref 26.6–33.0)
MCHC: 33.8 g/dL (ref 31.5–35.7)
MCV: 91 fL (ref 79–97)
Monocytes Absolute: 0.3 x10E3/uL (ref 0.1–0.9)
Monocytes: 6 %
Neutrophils Absolute: 3.7 x10E3/uL (ref 1.4–7.0)
Neutrophils: 64 %
Platelets: 272 x10E3/uL (ref 150–450)
RBC: 4.02 x10E6/uL (ref 3.77–5.28)
RDW: 14.2 % (ref 11.7–15.4)
WBC: 5.7 x10E3/uL (ref 3.4–10.8)

## 2020-06-17 LAB — HIV ANTIBODY (ROUTINE TESTING W REFLEX): HIV Screen 4th Generation wRfx: NONREACTIVE

## 2020-06-17 LAB — HEMOGLOBIN A1C
Est. average glucose Bld gHb Est-mCnc: 103 mg/dL
Hgb A1c MFr Bld: 5.2 % (ref 4.8–5.6)

## 2020-06-17 LAB — HEPATITIS C ANTIBODY: Hep C Virus Ab: <0.1 {s_co_ratio} (ref 0.0–0.9)

## 2020-06-21 LAB — CYTOLOGY - PAP
Adequacy: ABSENT
Chlamydia: NEGATIVE
Comment: NEGATIVE
Comment: NEGATIVE
Comment: NORMAL
Neisseria Gonorrhea: NEGATIVE
Trichomonas: NEGATIVE

## 2020-06-23 ENCOUNTER — Other Ambulatory Visit: Payer: Self-pay | Admitting: Family Medicine

## 2020-06-23 DIAGNOSIS — E7800 Pure hypercholesterolemia, unspecified: Secondary | ICD-10-CM

## 2020-06-23 MED ORDER — ROSUVASTATIN CALCIUM 5 MG PO TABS: 5.0000 mg | ORAL_TABLET | Freq: Every day | ORAL | 3 refills | Status: DC

## 2020-06-23 NOTE — Addendum Note (Signed)
Addended by: Eldred Manges on: 06/23/2020 11:33 AM   Modules accepted: Orders

## 2020-06-23 NOTE — Progress Notes (Signed)
Call patient:  Labs show cholesterol still high.  I would recommend beginning on cholesterol lowering medicine.  I have prescribed Crestor to take 5 mg daily.    Also, the pap test had a minimal abnormality.  I would recommend that in about 6 month she get this repeated by an Ob-Gyn doctor.  If she doesn't have one please refer her.  Although this is not a cancerous lesion, it can be pre-cancer early abnormal cells present and need to be certain it does not change.  If further questions contact me.  Janace Hoard MD

## 2020-08-30 ENCOUNTER — Ambulatory Visit: Payer: Managed Care, Other (non HMO) | Admitting: Women's Health

## 2020-09-20 ENCOUNTER — Other Ambulatory Visit: Payer: Self-pay

## 2020-09-20 ENCOUNTER — Encounter: Payer: Self-pay | Admitting: Medical

## 2020-09-20 ENCOUNTER — Ambulatory Visit (INDEPENDENT_AMBULATORY_CARE_PROVIDER_SITE_OTHER): Payer: Managed Care, Other (non HMO) | Admitting: Medical

## 2020-09-20 ENCOUNTER — Other Ambulatory Visit (HOSPITAL_COMMUNITY)
Admission: RE | Admit: 2020-09-20 | Discharge: 2020-09-20 | Disposition: A | Discharge status: Home / Self Care | Payer: Managed Care, Other (non HMO) | Source: Ambulatory Visit | Attending: Medical | Admitting: Medical

## 2020-09-20 VITALS — BP 125/82 | HR 89 | Ht 65.5 in | Wt 237.1 lb

## 2020-09-20 DIAGNOSIS — R87618 Other abnormal cytological findings on specimens from cervix uteri: Secondary | ICD-10-CM | POA: Diagnosis present

## 2020-09-20 NOTE — Addendum Note (Signed)
Addended by: Marny Lowenstein on: 09/20/2020 10:43 AM   Modules accepted: Orders

## 2020-09-20 NOTE — Progress Notes (Signed)
° ° °  GYNECOLOGY CLINIC COLPOSCOPY PROCEDURE NOTE  [name] is a 54 y.o. G2P2 here for colposcopy for low-grade squamous intraepithelial neoplasia (LGSIL - encompassing HPV,mild dysplasia,CIN I) pap smear on 06/16/20. Patient also has long history of mildly abnormal pap smear. HPV was not performed with sample in September, however she has had +HRHPV in the past. She had a normal pap smear with +HRHPV in 2016 and colposcopy following was normal. She had LSIL pap without HPV 06/2016 followed by a LEEP. LEEP specimens were negative for dysplasia. The pap from 05/2020 was the first since her LEEP.   Patient given informed consent, signed copy in the chart, time out was performed.  Placed in lithotomy position. Cervix viewed with speculum and colposcope after application of acetic acid.   Colposcopy adequate? No  acetowhite lesion(s) noted at 9 o'clock; biopsies obtained.  ECC specimen obtained. All specimens were labelled and sent to pathology.   Patient was given post procedure instructions.  Will follow up pathology and manage accordingly.  Routine preventative health maintenance measures emphasized.   Marny Lowenstein, PA-C 09/20/2020 10:35 AM

## 2020-09-20 NOTE — Patient Instructions (Signed)
Colposcopy, Care After This sheet gives you information about how to care for yourself after your procedure. Your doctor may also give you more specific instructions. If you have problems or questions, contact your doctor. What can I expect after the procedure? If you did not have a tissue sample removed (did not have a biopsy), you may only have some spotting for a few days. You can go back to your normal activities. If you had a tissue sample removed, it is common to have:  Soreness and pain. This may last for a few days.  Light-headedness.  Mild bleeding from your vagina or dark-colored, grainy discharge from your vagina. This may last for a few days. You may need to wear a sanitary pad.  Spotting for at least 48 hours after the procedure. Follow these instructions at home:   Take over-the-counter and prescription medicines only as told by your doctor. Ask your doctor what medicines you can start taking again. This is very important if you take blood-thinning medicine.  Do not drive or use heavy machinery while taking prescription pain medicine.  For 3 days, or as long as your doctor tells you, avoid: ? Douching. ? Using tampons. ? Having sex.  If you use birth control (contraception), keep using it.  Limit activity for the first day after the procedure. Ask your doctor what activities are safe for you.  It is up to you to get the results of your procedure. Ask your doctor when your results will be ready.  Keep all follow-up visits as told by your doctor. This is important. Contact a doctor if:  You get a skin rash. Get help right away if:  You are bleeding a lot from your vagina. It is a lot of bleeding if you are using more than one pad an hour for 2 hours in a row.  You have clumps of blood (blood clots) coming from your vagina.  You have a fever.  You have chills  You have pain in your lower belly (pelvic area).  You have signs of infection, such as vaginal  discharge that is: ? Different than usual. ? Yellow. ? Bad-smelling.  You have very pain or cramps in your lower belly that do not get better with medicine.  You feel light-headed.  You feel dizzy.  You pass out (faint). Summary  If you did not have a tissue sample removed (did not have a biopsy), you may only have some spotting for a few days. You can go back to your normal activities.  If you had a tissue sample removed, it is common to have mild pain and spotting for 48 hours.  For 3 days, or as long as your doctor tells you, avoid douching, using tampons and having sex.  Get help right away if you have bleeding, very bad pain, or signs of infection. This information is not intended to replace advice given to you by your health care provider. Make sure you discuss any questions you have with your health care provider. Document Revised: 08/22/2017 Document Reviewed: 05/29/2016 Elsevier Patient Education  2020 [employer]  

## 2020-09-26 LAB — SURGICAL PATHOLOGY

## 2020-09-29 ENCOUNTER — Other Ambulatory Visit: Payer: Self-pay | Admitting: Emergency Medicine

## 2020-09-29 DIAGNOSIS — Z1231 Encounter for screening mammogram for malignant neoplasm of breast: Secondary | ICD-10-CM

## 2020-10-03 ENCOUNTER — Telehealth: Payer: Self-pay | Admitting: *Deleted

## 2020-10-03 NOTE — Telephone Encounter (Signed)
-----   Message from Marny Lowenstein, PA-C sent at 10/03/2020  8:31 AM EST ----- Patient will need repeat pap with cotesting in 1 year. Does not have active MyChart. Please inform patient.   Vonzella Nipple, PA-C 10/03/2020 8:31 AM

## 2020-10-03 NOTE — Telephone Encounter (Signed)
Nikitha called back and I gave her results and recommendations per Vonzella Nipple, PA. She voices understanding. Jahred Tatar,RN

## 2020-10-03 NOTE — Telephone Encounter (Signed)
I called Dana Reese at both her mobile/home number and work numbers and left message I am calling with nonurgent information from your provider; please call the office. Armando Lauman,RN

## 2020-11-17 ENCOUNTER — Other Ambulatory Visit: Payer: Self-pay

## 2020-11-17 ENCOUNTER — Ambulatory Visit
Admission: RE | Admit: 2020-11-17 | Discharge: 2020-11-17 | Disposition: A | Discharge status: Home / Self Care | Payer: Managed Care, Other (non HMO) | Source: Ambulatory Visit | Attending: Emergency Medicine | Admitting: Emergency Medicine

## 2020-11-17 DIAGNOSIS — Z1231 Encounter for screening mammogram for malignant neoplasm of breast: Secondary | ICD-10-CM

## 2020-11-22 ENCOUNTER — Encounter: Payer: Self-pay | Admitting: Emergency Medicine

## 2020-12-14 ENCOUNTER — Ambulatory Visit: Payer: Managed Care, Other (non HMO) | Admitting: Emergency Medicine

## 2021-07-17 ENCOUNTER — Encounter: Payer: Self-pay | Admitting: Emergency Medicine

## 2021-07-17 ENCOUNTER — Ambulatory Visit: Payer: Managed Care, Other (non HMO) | Admitting: Emergency Medicine

## 2021-07-17 ENCOUNTER — Other Ambulatory Visit: Payer: Self-pay

## 2021-07-17 VITALS — BP 116/70 | HR 93 | Temp 98.1°F | Ht 65.0 in | Wt 239.0 lb

## 2021-07-17 DIAGNOSIS — I10 Essential (primary) hypertension: Secondary | ICD-10-CM

## 2021-07-17 DIAGNOSIS — Z23 Encounter for immunization: Secondary | ICD-10-CM | POA: Diagnosis not present

## 2021-07-17 MED ORDER — LISINOPRIL-HYDROCHLOROTHIAZIDE 20-12.5 MG PO TABS
1.0000 | ORAL_TABLET | Freq: Every day | ORAL | 3 refills | Status: DC
Start: 2021-07-17 — End: 2021-12-25

## 2021-07-17 NOTE — Progress Notes (Signed)
Dana Reese 55 y.o.   Chief Complaint  Patient presents with   Medication Refill    HISTORY OF PRESENT ILLNESS: This is a 55 y.o. female with a history of hypertension on Zestoretic 20-12.5 mg daily here for follow-up and medication refill. Doing well.  Has no other complaints or medical concerns today.  Medication Refill Pertinent negatives include no abdominal pain, chest pain, chills, congestion, coughing, fever, headaches, nausea, rash, sore throat or vomiting.    Prior to Admission medications   Medication Sig Start Date End Date Taking? Authorizing Provider  lisinopril-hydrochlorothiazide (ZESTORETIC) 20-12.5 MG tablet Take 1 tablet by mouth daily. 06/16/20 07/17/21 Yes Peyton Najjar, MD  lisinopril-hydrochlorothiazide (ZESTORETIC) 20-12.5 MG tablet Take 1 tablet by mouth daily. 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   LGSIL on Pap smear of cervix 09/06/2016   Abnormal Papanicolaou smear of cervix with positive human papilloma virus (HPV) test 10/04/2015   Perimenopause 10/04/2015   Macromastia 07/14/2015   Essential hypertension, benign 10/24/2012   Family history of colon cancer 10/24/2012   Family history of breast cancer in first degree relative 10/24/2012    Past Medical History:  Diagnosis Date   Allergy    ASCUS with positive high risk HPV 05/25/2011   Chlamydia 09/23/2010   Hyperlipidemia    Hypertension 09/23/2005   Symptomatic mammary hypertrophy    VAIN II (vaginal intraepithelial neoplasia grade II) 10/2016    Past Surgical History:  Procedure Laterality Date   Colonoscopy  06/23/2012   normal; repeat in 10 years; [facility].   COLONOSCOPY     TUBAL LIGATION      Social History   Socioeconomic History   Marital status: Single    Spouse name: Not on file    Number of children: Not on file   Years of education: Not on file   Highest education level: Not on file  Occupational History   Not on file  Tobacco Use   Smoking status: Never   Smokeless tobacco: Never  Vaping Use   Vaping Use: Never used  Substance and Sexual Activity   Alcohol use: No    Alcohol/week: 0.0 standard drinks   Drug use: No   Sexual activity: Yes    Partners: Male    Birth control/protection: Surgical  Other Topics Concern   Not on file  Social History Narrative   Marital status: single; dating seriously x 10 years; happy; no abuse      Children: 2 (33, 81)  One grandchild      Lives: with daughter, brother, grandchild      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      Tobacco: none       Alcohol: none      Drugs: none      Exercise:  None in 2018      Sexually activity: yes; history of Chlamydia 2012.        Seatbelt: 100% of time.      Guns: none   Social Determinants of Corporate investment banker Strain: Not on file  Food Insecurity: No Food Insecurity   Worried About Programme researcher, broadcasting/film/video in the Last Year: Never true   Ran Out of Food in the Last Year: Never true  Transportation Needs:  No Transportation Needs   Lack of Transportation (Medical): No   Lack of Transportation (Non-Medical): No  Physical Activity: Not on file  Stress: Not on file  Social Connections: Not on file  Intimate Partner Violence: Not on file    Family History  Problem Relation Age of Onset   Colon cancer Mother 35       colon cancer   Cancer Mother        Breast cancer age 21; colon cancer age 59; bladder cancer age 64.   Hypertension Mother    Hyperlipidemia Mother    Breast cancer Mother 74       Breast cancer   Cancer Maternal Grandmother        COLON   Heart disease Maternal Grandfather    Cancer Father 78       colon cancer.   Diabetes Daughter    Esophageal cancer Neg Hx    Liver cancer Neg Hx    Pancreatic cancer  Neg Hx    Rectal cancer Neg Hx    Stomach cancer Neg Hx      Review of Systems  Constitutional: Negative.  Negative for chills and fever.  HENT: Negative.  Negative for congestion and sore throat.   Respiratory: Negative.  Negative for cough and shortness of breath.   Cardiovascular: Negative.  Negative for chest pain and palpitations.  Gastrointestinal: Negative.  Negative for abdominal pain, diarrhea, nausea and vomiting.  Genitourinary: Negative.  Negative for dysuria and hematuria.  Musculoskeletal: Negative.   Skin: Negative.  Negative for rash.  Neurological:  Negative for dizziness and headaches.  All other systems reviewed and are negative.  Today's Vitals   07/17/21 1613  BP: 116/70  Pulse: 93  Temp: 98.1 F (36.7 C)  TempSrc: Oral  SpO2: 97%  Weight: 239 lb (108.4 kg)  Height: 5\' 5"  (1.651 m)   Body mass index is 39.77 kg/m.  Physical Exam Vitals reviewed.  Constitutional:      Appearance: Normal appearance.  HENT:     Head: Normocephalic.  Eyes:     Extraocular Movements: Extraocular movements intact.     Conjunctiva/sclera: Conjunctivae normal.     Pupils: Pupils are equal, round, and reactive to light.  Cardiovascular:     Rate and Rhythm: Regular rhythm.     Pulses: Normal pulses.     Heart sounds: Normal heart sounds.  Pulmonary:     Effort: Pulmonary effort is normal.     Breath sounds: Normal breath sounds.  Musculoskeletal:     Cervical back: Normal range of motion and neck supple.     Right lower leg: No edema.     Left lower leg: No edema.  Skin:    General: Skin is warm and dry.     Capillary Refill: Capillary refill takes less than 2 seconds.  Neurological:     General: No focal deficit present.     Mental Status: She is alert and oriented to person, place, and time.  Psychiatric:        Mood and Affect: Mood normal.        Behavior: Behavior normal.     ASSESSMENT & PLAN: Problem List Items Addressed This Visit        Cardiovascular and Mediastinum   Essential hypertension, benign - Primary    Well-controlled hypertension. BP Readings from Last 3 Encounters:  07/17/21 116/70  09/20/20 125/82  06/16/20 122/81  Continue Zestoretic 20-12.5 mg daily. Dietary approaches to stop hypertension discussed.  Follow-up in 6 months.       Relevant Medications   lisinopril-hydrochlorothiazide (ZESTORETIC) 20-12.5 MG tablet   Other Visit Diagnoses     Flu vaccine need       Relevant Orders   Flu Vaccine QUAD 48mo+IM (Fluarix, Fluzone & Alfiuria Quad PF) (Completed)      Patient Instructions  Health Maintenance, Female Adopting a healthy lifestyle and getting preventive care are important in promoting health and wellness. Ask your health care provider about: The right schedule for you to have regular tests and exams. Things you can do on your own to prevent diseases and keep yourself healthy. What should I know about diet, weight, and exercise? Eat a healthy diet  Eat a diet that includes plenty of vegetables, fruits, low-fat dairy products, and lean protein. Do not eat a lot of foods that are high in solid fats, added sugars, or sodium. Maintain a healthy weight Body mass index (BMI) is used to identify weight problems. It estimates body fat based on height and weight. Your health care provider can help determine your BMI and help you achieve or maintain a healthy weight. Get regular exercise Get regular exercise. This is one of the most important things you can do for your health. Most adults should: Exercise for at least 150 minutes each week. The exercise should increase your heart rate and make you sweat (moderate-intensity exercise). Do strengthening exercises at least twice a week. This is in addition to the moderate-intensity exercise. Spend less time sitting. Even light physical activity can be beneficial. Watch cholesterol and blood lipids Have your blood tested for lipids and cholesterol at 56  years of age, then have this test every 5 years. Have your cholesterol levels checked more often if: Your lipid or cholesterol levels are high. You are older than 56 years of age. You are at high risk for heart disease. What should I know about cancer screening? Depending on your health history and family history, you may need to have cancer screening at various ages. This may include screening for: Breast cancer. Cervical cancer. Colorectal cancer. Skin cancer. Lung cancer. What should I know about heart disease, diabetes, and high blood pressure? Blood pressure and heart disease High blood pressure causes heart disease and increases the risk of stroke. This is more likely to develop in people who have high blood pressure readings, are of African descent, or are overweight. Have your blood pressure checked: Every 3-5 years if you are 73-35 years of age. Every year if you are 47 years old or older. Diabetes Have regular diabetes screenings. This checks your fasting blood sugar level. Have the screening done: Once every three years after age 29 if you are at a normal weight and have a low risk for diabetes. More often and at a younger age if you are overweight or have a high risk for diabetes. What should I know about preventing infection? Hepatitis B If you have a higher risk for hepatitis B, you should be screened for this virus. Talk with your health care provider to find out if you are at risk for hepatitis B infection. Hepatitis C Testing is recommended for: Everyone born from [birth year] through 1965. Anyone with known risk factors for hepatitis C. Sexually transmitted infections (STIs) Get screened for STIs, including gonorrhea and chlamydia, if: You are sexually active and are younger than 56 years of age. You are older than 56 years of age and your health care provider tells you that you are  at risk for this type of infection. Your sexual activity has changed since you were last  screened, and you are at increased risk for chlamydia or gonorrhea. Ask your health care provider if you are at risk. Ask your health care provider about whether you are at high risk for HIV. Your health care provider may recommend a prescription medicine to help prevent HIV infection. If you choose to take medicine to prevent HIV, you should first get tested for HIV. You should then be tested every 3 months for as long as you are taking the medicine. Pregnancy If you are about to stop having your period (premenopausal) and you may become pregnant, seek counseling before you get pregnant. Take 400 to 800 micrograms (mcg) of folic acid every day if you become pregnant. Ask for birth control (contraception) if you want to prevent pregnancy. Osteoporosis and menopause Osteoporosis is a disease in which the bones lose minerals and strength with aging. This can result in bone fractures. If you are 34 years old or older, or if you are at risk for osteoporosis and fractures, ask your health care provider if you should: Be screened for bone loss. Take a calcium or vitamin D supplement to lower your risk of fractures. Be given hormone replacement therapy (HRT) to treat symptoms of menopause. Follow these instructions at home: Lifestyle Do not use any products that contain nicotine or tobacco, such as cigarettes, e-cigarettes, and chewing tobacco. If you need help quitting, ask your health care provider. Do not use street drugs. Do not share needles. Ask your health care provider for help if you need support or information about quitting drugs. Alcohol use Do not drink alcohol if: Your health care provider tells you not to drink. You are pregnant, may be pregnant, or are planning to become pregnant. If you drink alcohol: Limit how much you use to 0-1 drink a day. Limit intake if you are breastfeeding. Be aware of how much alcohol is in your drink. In the [place] one drink equals one 12 oz bottle of beer  (355 mL), one 5 oz glass of wine (148 mL), or one 1 oz glass of hard liquor (44 mL). General instructions Schedule regular health, dental, and eye exams. Stay current with your vaccines. Tell your health care provider if: You often feel depressed. You have ever been abused or do not feel safe at home. Summary Adopting a healthy lifestyle and getting preventive care are important in promoting health and wellness. Follow your health care provider's instructions about healthy diet, exercising, and getting tested or screened for diseases. Follow your health care provider's instructions on monitoring your cholesterol and blood pressure. This information is not intended to replace advice given to you by your health care provider. Make sure you discuss any questions you have with your health care provider. Document Revised: 11/17/2020 Document Reviewed: 09/02/2018 Elsevier Patient Education  2022 Elsevier Inc.    Edwina Barth, MD [facility]

## 2021-07-17 NOTE — Assessment & Plan Note (Signed)
Well-controlled hypertension. BP Readings from Last 3 Encounters:  07/17/21 116/70  09/20/20 125/82  06/16/20 122/81  Continue Zestoretic 20-12.5 mg daily. Dietary approaches to stop hypertension discussed. Follow-up in 6 months.

## 2021-07-17 NOTE — Patient Instructions (Signed)
Health Maintenance, Female Adopting a healthy lifestyle and getting preventive care are important in promoting health and wellness. Ask your health care provider about: The right schedule for you to have regular tests and exams. Things you can do on your own to prevent diseases and keep yourself healthy. What should I know about diet, weight, and exercise? Eat a healthy diet  Eat a diet that includes plenty of vegetables, fruits, low-fat dairy products, and lean protein. Do not eat a lot of foods that are high in solid fats, added sugars, or sodium. Maintain a healthy weight Body mass index (BMI) is used to identify weight problems. It estimates body fat based on height and weight. Your health care provider can help determine your BMI and help you achieve or maintain a healthy weight. Get regular exercise Get regular exercise. This is one of the most important things you can do for your health. Most adults should: Exercise for at least 150 minutes each week. The exercise should increase your heart rate and make you sweat (moderate-intensity exercise). Do strengthening exercises at least twice a week. This is in addition to the moderate-intensity exercise. Spend less time sitting. Even light physical activity can be beneficial. Watch cholesterol and blood lipids Have your blood tested for lipids and cholesterol at 56 years of age, then have this test every 5 years. Have your cholesterol levels checked more often if: Your lipid or cholesterol levels are high. You are older than 56 years of age. You are at high risk for heart disease. What should I know about cancer screening? Depending on your health history and family history, you may need to have cancer screening at various ages. This may include screening for: Breast cancer. Cervical cancer. Colorectal cancer. Skin cancer. Lung cancer. What should I know about heart disease, diabetes, and high blood pressure? Blood pressure and heart  disease High blood pressure causes heart disease and increases the risk of stroke. This is more likely to develop in people who have high blood pressure readings, are of African descent, or are overweight. Have your blood pressure checked: Every 3-5 years if you are 18-39 years of age. Every year if you are 40 years old or older. Diabetes Have regular diabetes screenings. This checks your fasting blood sugar level. Have the screening done: Once every three years after age 40 if you are at a normal weight and have a low risk for diabetes. More often and at a younger age if you are overweight or have a high risk for diabetes. What should I know about preventing infection? Hepatitis B If you have a higher risk for hepatitis B, you should be screened for this virus. Talk with your health care provider to find out if you are at risk for hepatitis B infection. Hepatitis C Testing is recommended for: Everyone born from 1945 through 1965. Anyone with known risk factors for hepatitis C. Sexually transmitted infections (STIs) Get screened for STIs, including gonorrhea and chlamydia, if: You are sexually active and are younger than 56 years of age. You are older than 56 years of age and your health care provider tells you that you are at risk for this type of infection. Your sexual activity has changed since you were last screened, and you are at increased risk for chlamydia or gonorrhea. Ask your health care provider if you are at risk. Ask your health care provider about whether you are at high risk for HIV. Your health care provider may recommend a prescription medicine   to help prevent HIV infection. If you choose to take medicine to prevent HIV, you should first get tested for HIV. You should then be tested every 3 months for as long as you are taking the medicine. Pregnancy If you are about to stop having your period (premenopausal) and you may become pregnant, seek counseling before you get  pregnant. Take 400 to 800 micrograms (mcg) of folic acid every day if you become pregnant. Ask for birth control (contraception) if you want to prevent pregnancy. Osteoporosis and menopause Osteoporosis is a disease in which the bones lose minerals and strength with aging. This can result in bone fractures. If you are 65 years old or older, or if you are at risk for osteoporosis and fractures, ask your health care provider if you should: Be screened for bone loss. Take a calcium or vitamin D supplement to lower your risk of fractures. Be given hormone replacement therapy (HRT) to treat symptoms of menopause. Follow these instructions at home: Lifestyle Do not use any products that contain nicotine or tobacco, such as cigarettes, e-cigarettes, and chewing tobacco. If you need help quitting, ask your health care provider. Do not use street drugs. Do not share needles. Ask your health care provider for help if you need support or information about quitting drugs. Alcohol use Do not drink alcohol if: Your health care provider tells you not to drink. You are pregnant, may be pregnant, or are planning to become pregnant. If you drink alcohol: Limit how much you use to 0-1 drink a day. Limit intake if you are breastfeeding. Be aware of how much alcohol is in your drink. In the [place] one drink equals one 12 oz bottle of beer (355 mL), one 5 oz glass of wine (148 mL), or one 1 oz glass of hard liquor (44 mL). General instructions Schedule regular health, dental, and eye exams. Stay current with your vaccines. Tell your health care provider if: You often feel depressed. You have ever been abused or do not feel safe at home. Summary Adopting a healthy lifestyle and getting preventive care are important in promoting health and wellness. Follow your health care provider's instructions about healthy diet, exercising, and getting tested or screened for diseases. Follow your health care provider's  instructions on monitoring your cholesterol and blood pressure. This information is not intended to replace advice given to you by your health care provider. Make sure you discuss any questions you have with your health care provider. Document Revised: 11/17/2020 Document Reviewed: 09/02/2018 Elsevier Patient Education  2022 Elsevier Inc.  

## 2021-10-17 ENCOUNTER — Other Ambulatory Visit: Payer: Self-pay | Admitting: Emergency Medicine

## 2021-10-31 ENCOUNTER — Other Ambulatory Visit: Payer: Self-pay | Admitting: Emergency Medicine

## 2021-10-31 DIAGNOSIS — Z1231 Encounter for screening mammogram for malignant neoplasm of breast: Secondary | ICD-10-CM

## 2021-11-23 ENCOUNTER — Ambulatory Visit
Admission: RE | Admit: 2021-11-23 | Discharge: 2021-11-23 | Disposition: A | Discharge status: Home / Self Care | Payer: Managed Care, Other (non HMO) | Source: Ambulatory Visit | Attending: Emergency Medicine | Admitting: Emergency Medicine

## 2021-11-23 DIAGNOSIS — Z1231 Encounter for screening mammogram for malignant neoplasm of breast: Secondary | ICD-10-CM

## 2021-12-13 ENCOUNTER — Encounter: Payer: Managed Care, Other (non HMO) | Admitting: Emergency Medicine

## 2021-12-20 ENCOUNTER — Encounter: Payer: Managed Care, Other (non HMO) | Admitting: Emergency Medicine

## 2021-12-25 ENCOUNTER — Ambulatory Visit (INDEPENDENT_AMBULATORY_CARE_PROVIDER_SITE_OTHER): Payer: Managed Care, Other (non HMO) | Admitting: Emergency Medicine

## 2021-12-25 ENCOUNTER — Encounter: Payer: Self-pay | Admitting: Emergency Medicine

## 2021-12-25 VITALS — BP 134/88 | HR 85 | Temp 98.1°F | Ht 65.0 in | Wt 241.2 lb

## 2021-12-25 DIAGNOSIS — Z130 Encounter for screening for diseases of the blood and blood-forming organs and certain disorders involving the immune mechanism: Secondary | ICD-10-CM

## 2021-12-25 DIAGNOSIS — Z0000 Encounter for general adult medical examination without abnormal findings: Secondary | ICD-10-CM | POA: Diagnosis not present

## 2021-12-25 DIAGNOSIS — Z13220 Encounter for screening for lipoid disorders: Secondary | ICD-10-CM

## 2021-12-25 DIAGNOSIS — I10 Essential (primary) hypertension: Secondary | ICD-10-CM

## 2021-12-25 DIAGNOSIS — Z23 Encounter for immunization: Secondary | ICD-10-CM | POA: Diagnosis not present

## 2021-12-25 DIAGNOSIS — Z13228 Encounter for screening for other metabolic disorders: Secondary | ICD-10-CM

## 2021-12-25 DIAGNOSIS — Z1329 Encounter for screening for other suspected endocrine disorder: Secondary | ICD-10-CM

## 2021-12-25 LAB — COMPREHENSIVE METABOLIC PANEL
ALT: 6 U/L (ref 0–35)
AST: 14 U/L (ref 0–37)
Albumin: 4.1 g/dL (ref 3.5–5.2)
Alkaline Phosphatase: 67 U/L (ref 39–117)
BUN: 13 mg/dL (ref 6–23)
CO2: 29 mEq/L (ref 19–32)
Calcium: 9.9 mg/dL (ref 8.4–10.5)
Chloride: 103 mEq/L (ref 96–112)
Creatinine, Ser: 0.97 mg/dL (ref 0.40–1.20)
GFR: 65.28 mL/min (ref >60.00)
Glucose, Bld: 92 mg/dL (ref 70–99)
Potassium: 3.9 mEq/L (ref 3.5–5.1)
Sodium: 139 mEq/L (ref 135–145)
Total Bilirubin: 0.6 mg/dL (ref 0.2–1.2)
Total Protein: 7.3 g/dL (ref 6.0–8.3)

## 2021-12-25 LAB — CBC WITH DIFFERENTIAL/PLATELET
Basophils Absolute: 0 10*3/uL (ref 0.0–0.1)
Basophils Relative: 0.4 % (ref 0.0–3.0)
Eosinophils Absolute: 0.2 10*3/uL (ref 0.0–0.7)
Eosinophils Relative: 2.5 % (ref 0.0–5.0)
HCT: 35.9 % — ABNORMAL LOW (ref 36.0–46.0)
Hemoglobin: 12.3 g/dL (ref 12.0–15.0)
Lymphocytes Relative: 20.8 % (ref 12.0–46.0)
Lymphs Abs: 1.6 10*3/uL (ref 0.7–4.0)
MCHC: 34.4 g/dL (ref 30.0–36.0)
MCV: 89.9 fl (ref 78.0–100.0)
Monocytes Absolute: 0.4 10*3/uL (ref 0.1–1.0)
Monocytes Relative: 4.6 % (ref 3.0–12.0)
Neutro Abs: 5.6 10*3/uL (ref 1.4–7.7)
Neutrophils Relative %: 71.7 % (ref 43.0–77.0)
Platelets: 252 10*3/uL (ref 150.0–400.0)
RBC: 4 Mil/uL (ref 3.87–5.11)
RDW: 14.6 % (ref 11.5–15.5)
WBC: 7.8 10*3/uL (ref 4.0–10.5)

## 2021-12-25 LAB — LIPID PANEL
Cholesterol: 239 mg/dL — ABNORMAL HIGH (ref 0–200)
HDL: 49.9 mg/dL (ref >39.00)
LDL Cholesterol: 167 mg/dL — ABNORMAL HIGH (ref 0–99)
NonHDL: 189.48
Total CHOL/HDL Ratio: 5
Triglycerides: 113 mg/dL (ref 0.0–149.0)
VLDL: 22.6 mg/dL (ref 0.0–40.0)

## 2021-12-25 LAB — HEMOGLOBIN A1C: Hgb A1c MFr Bld: 5.2 % (ref 4.6–6.5)

## 2021-12-25 MED ORDER — LISINOPRIL-HYDROCHLOROTHIAZIDE 20-12.5 MG PO TABS: 1.0000 | ORAL_TABLET | Freq: Every day | ORAL | 3 refills | Status: DC

## 2021-12-25 NOTE — Progress Notes (Signed)
Dana Reese ?56 y.o. ? ? ?Chief Complaint  ?Patient presents with  ? Annual Exam  ?  No concerns  ? ? ?HISTORY OF PRESENT ILLNESS: ?This is a 56 y.o. female here for annual exam. ?Doing well.  Has no complaints or medical concerns today. ?History of hypertension on Zestoretic.  Needs medication refill. ? ?HPI ? ? ?Prior to Admission medications   ?Medication Sig Start Date End Date Taking? Authorizing Provider  ?lisinopril-hydrochlorothiazide (ZESTORETIC) 20-12.5 MG tablet Take 1 tablet by mouth daily. 07/17/21 10/15/21  Horald Pollen, MD  ? ? ?No Known Allergies ? ?Patient Active Problem List  ? Diagnosis Date Noted  ? Degenerative disc disease, cervical 11/16/2017  ? 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 LGSIL on Pap smear of cervix 09/06/2016  ? Abnormal Papanicolaou smear of cervix with positive human papilloma virus (HPV) test 10/04/2015  ? Perimenopause 10/04/2015  ? Macromastia 07/14/2015  ? Essential hypertension, benign 10/24/2012  ? Family history of colon cancer 10/24/2012  ? Family history of breast cancer in first degree relative 10/24/2012  ? ? ?Past Medical History:  ?Diagnosis Date  ? Allergy   ? ASCUS with positive high risk HPV 05/25/2011  ? Chlamydia 09/23/2010  ? Hyperlipidemia   ? Hypertension 09/23/2005  ? Symptomatic mammary hypertrophy   ? VAIN II (vaginal intraepithelial neoplasia grade II) 10/2016  ? ? ?Past Surgical History:  ?Procedure Laterality Date  ? Colonoscopy  06/23/2012  ? normal; repeat in 10 years; [facility].  ? COLONOSCOPY    ? TUBAL LIGATION    ? ? ?Social History  ? ?Socioeconomic History  ? Marital status: Single  ?  Spouse name: Not on file  ? Number of children: Not on file  ? Years of education: Not on file  ? Highest education level: Not on file  ?Occupational History  ? Not on file  ?Tobacco Use  ? Smoking status: Never  ? Smokeless tobacco: Never  ?Vaping Use  ? Vaping Use: Never used  ?Substance and Sexual Activity  ? Alcohol use: No   ?  Alcohol/week: 0.0 standard drinks  ? Drug use: No  ? Sexual activity: Yes  ?  Partners: Male  ?  Birth control/protection: Surgical  ?Other Topics Concern  ? Not on file  ?Social History Narrative  ? Marital status: single; dating seriously x 10 years; happy; no abuse  ?    Children: 2 (33, 18)  One grandchild  ?    Lives: with daughter, brother, grandchild  ?    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  ?    Tobacco: none  ?     Alcohol: none  ?    Drugs: none  ?    Exercise:  None in 2018  ?    Sexually activity: yes; history of Chlamydia 2012.    ?    Seatbelt: 100% of time.  ?    Guns: none  ? ?Social Determinants of Health  ? ?Financial Resource Strain: Not on file  ?Food Insecurity: Not on file  ?Transportation Needs: Not on file  ?Physical Activity: Not on file  ?Stress: Not on file  ?Social Connections: Not on file  ?Intimate Partner Violence: Not on file  ? ? ?Family History  ?Problem Relation Age of Onset  ? Colon cancer Mother 21  ?     colon cancer  ? Cancer Mother   ?     Breast  cancer age 46; colon cancer age 28; bladder cancer age 62.  ? Hypertension Mother   ? Hyperlipidemia Mother   ? Breast cancer Mother 69  ?     Breast cancer  ? Cancer Maternal Grandmother   ?     COLON  ? Heart disease Maternal Grandfather   ? Cancer Father 35  ?     colon cancer.  ? Diabetes Daughter   ? Esophageal cancer Neg Hx   ? Liver cancer Neg Hx   ? Pancreatic cancer Neg Hx   ? Rectal cancer Neg Hx   ? Stomach cancer Neg Hx   ? ? ? ?Review of Systems  ?Constitutional: Negative.  Negative for chills and fever.  ?HENT: Negative.  Negative for congestion and sore throat.   ?Respiratory: Negative.  Negative for cough and shortness of breath.   ?Cardiovascular: Negative.  Negative for chest pain and palpitations.  ?Gastrointestinal:  Negative for abdominal pain, diarrhea, nausea and vomiting.  ?Genitourinary: Negative.  Negative for dysuria and hematuria.  ?Skin: Negative.  Negative  for rash.  ?Neurological:  Negative for dizziness and headaches.  ?All other systems reviewed and are negative. ? ?Today's Vitals  ? 12/25/21 0857  ?BP: 134/88  ?Pulse: 85  ?Temp: 98.1 ?F (36.7 ?C)  ?TempSrc: Oral  ?SpO2: 96%  ?Weight: 241 lb 4 oz (109.4 kg)  ?Height: 5\' 5"  (1.651 m)  ? ?Body mass index is 40.15 kg/m?. ?Wt Readings from Last 3 Encounters:  ?12/25/21 241 lb 4 oz (109.4 kg)  ?07/17/21 239 lb (108.4 kg)  ?09/20/20 237 lb 1.6 oz (107.5 kg)  ? ? ?Physical Exam ?Vitals reviewed.  ?Constitutional:   ?   Appearance: Normal appearance.  ?HENT:  ?   Head: Normocephalic.  ?   Right Ear: Tympanic membrane, ear canal and external ear normal.  ?   Left Ear: Tympanic membrane, ear canal and external ear normal.  ?Eyes:  ?   Extraocular Movements: Extraocular movements intact.  ?   Conjunctiva/sclera: Conjunctivae normal.  ?   Pupils: Pupils are equal, round, and reactive to light.  ?Cardiovascular:  ?   Rate and Rhythm: Normal rate and regular rhythm.  ?   Pulses: Normal pulses.  ?   Heart sounds: Normal heart sounds.  ?Pulmonary:  ?   Effort: Pulmonary effort is normal.  ?   Breath sounds: Normal breath sounds.  ?Abdominal:  ?   General: There is no distension.  ?   Palpations: Abdomen is soft.  ?   Tenderness: There is no abdominal tenderness.  ?Musculoskeletal:  ?   Cervical back: No tenderness.  ?   Right lower leg: No edema.  ?   Left lower leg: No edema.  ?Lymphadenopathy:  ?   Cervical: No cervical adenopathy.  ?Skin: ?   General: Skin is warm and dry.  ?   Capillary Refill: Capillary refill takes less than 2 seconds.  ?Neurological:  ?   General: No focal deficit present.  ?   Mental Status: She is alert and oriented to person, place, and time.  ?Psychiatric:     ?   Mood and Affect: Mood normal.     ?   Behavior: Behavior normal.  ? ? ? ?ASSESSMENT & PLAN: ?Problem List Items Addressed This Visit   ? ?  ? Cardiovascular and Mediastinum  ? Essential hypertension, benign  ? Relevant Medications  ?  lisinopril-hydrochlorothiazide (ZESTORETIC) 20-12.5 MG tablet  ? Other Relevant Orders  ? Comprehensive metabolic  panel  ? ?Other Visit Diagnoses   ? ? Routine general medical examination at a health care facility    -  Primary  ? Relevant Orders  ? Ambulatory referral to Gynecology  ? Screening for deficiency anemia      ? Relevant Orders  ? CBC with Differential  ? Screening for lipoid disorders      ? Relevant Orders  ? Lipid panel  ? Hemoglobin A1c  ? Screening for endocrine, metabolic and immunity disorder      ? Need for shingles vaccine      ? Relevant Orders  ? Varicella-zoster vaccine IM (Shingrix) (Completed)  ? ?  ? ?Modifiable risk factors discussed with patient. ?Anticipatory guidance according to age provided. ?The following topics were also discussed: ?Social Determinants of Health ?Smoking.  Non-smoker ?Diet and nutrition and need to decrease amount of daily carbohydrate intake ?Benefits of exercise ?Cancer screening and review of most recent mammogram and colonoscopy reports ?Vaccinations recommendations ?Cardiovascular risk assessment ?The 10-year ASCVD risk score (Arnett DK, et al., 2019) is: 8.3% ?  Values used to calculate the score: ?    Age: 52 years ?    Sex: Female ?    Is Non-Hispanic African American: Yes ?    Diabetic: No ?    Tobacco smoker: No ?    Systolic Blood Pressure: Q000111Q mmHg ?    Is BP treated: Yes ?    HDL Cholesterol: 49 mg/dL ?    Total Cholesterol: 250 mg/dL ?Hypertension management.  Cardiovascular risk associated with hypertension ?Medication review ?Mental health including depression and anxiety ?Fall and accident prevention ? ?Patient Instructions  ?Health Maintenance, Female ?Adopting a healthy lifestyle and getting preventive care are important in promoting health and wellness. Ask your health care provider about: ?The right schedule for you to have regular tests and exams. ?Things you can do on your own to prevent diseases and keep yourself healthy. ?What should I know  about diet, weight, and exercise? ?Eat a healthy diet ? ?Eat a diet that includes plenty of vegetables, fruits, low-fat dairy products, and lean protein. ?Do not eat a lot of foods that are high in solid fats,

## 2021-12-25 NOTE — Patient Instructions (Signed)

## 2021-12-26 ENCOUNTER — Other Ambulatory Visit: Payer: Self-pay | Admitting: Emergency Medicine

## 2021-12-26 DIAGNOSIS — E785 Hyperlipidemia, unspecified: Secondary | ICD-10-CM

## 2021-12-26 MED ORDER — ROSUVASTATIN CALCIUM 10 MG PO TABS: 10.0000 mg | ORAL_TABLET | Freq: Every day | ORAL | 3 refills | Status: AC

## 2021-12-26 NOTE — Progress Notes (Signed)
Lipid profile shows elevated cholesterol. ?The 10-year ASCVD risk score (Arnett DK, et al., 2019) is: 7.8% ?  Values used to calculate the score: ?    Age: 56 years ?    Sex: Female ?    Is Non-Hispanic African American: Yes ?    Diabetic: No ?    Tobacco smoker: No ?    Systolic Blood Pressure: 134 mmHg ?    Is BP treated: Yes ?    HDL Cholesterol: 49.9 mg/dL ?    Total Cholesterol: 239 mg/dL ?Recommend to start rosuvastatin 10 mg daily. ?

## 2022-01-03 ENCOUNTER — Telehealth: Payer: Self-pay

## 2022-01-03 NOTE — Telephone Encounter (Signed)
Pt called for results. I advised pt Dr. Alvy Bimler result notes states Normal labs except elevated cholesterol which has been elevated in the past.  Recommend to start rosuvastatin 10 mg daily.  New prescription sent to [facility] on [address]. ? ?Pt had no further question and agreed to begin medication ? ?FYI  ?

## 2022-01-03 NOTE — Telephone Encounter (Signed)
Noted../lb 

## 2022-03-08 ENCOUNTER — Ambulatory Visit: Payer: Managed Care, Other (non HMO) | Admitting: Medical

## 2022-03-22 ENCOUNTER — Encounter: Payer: Self-pay | Admitting: Medical

## 2022-03-22 ENCOUNTER — Ambulatory Visit (INDEPENDENT_AMBULATORY_CARE_PROVIDER_SITE_OTHER): Payer: Managed Care, Other (non HMO) | Admitting: Medical

## 2022-03-22 ENCOUNTER — Other Ambulatory Visit (HOSPITAL_COMMUNITY)
Admission: RE | Admit: 2022-03-22 | Discharge: 2022-03-22 | Disposition: A | Discharge status: Home / Self Care | Payer: Managed Care, Other (non HMO) | Source: Ambulatory Visit | Attending: Medical | Admitting: Medical

## 2022-03-22 ENCOUNTER — Other Ambulatory Visit: Payer: Self-pay

## 2022-03-22 VITALS — BP 130/90 | HR 93 | Ht 65.5 in | Wt 238.7 lb

## 2022-03-22 DIAGNOSIS — Z01419 Encounter for gynecological examination (general) (routine) without abnormal findings: Secondary | ICD-10-CM

## 2022-03-22 DIAGNOSIS — R87612 Low grade squamous intraepithelial lesion on cytologic smear of cervix (LGSIL): Secondary | ICD-10-CM | POA: Insufficient documentation

## 2022-03-22 DIAGNOSIS — Z124 Encounter for screening for malignant neoplasm of cervix: Secondary | ICD-10-CM

## 2022-03-22 DIAGNOSIS — Z113 Encounter for screening for infections with a predominantly sexual mode of transmission: Secondary | ICD-10-CM

## 2022-03-22 NOTE — Progress Notes (Signed)
History:  [name] is a 56 y.o. G2P2 who presents to clinic today for annual exam and pap smear. Patient has a long history of abnormal pap smear, colposcopy and LEEP starting in 2017. 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 One year follow-up was recommended. She states she is also having irregular bleeding episodes since she was last in this office. She has never gone more than a couple of months without a period. She denies abnormal discharge, pelvic pain, GI issues, breast concerns or UTI symptoms.  She had a normal mammogram 11/2021.   The following portions of the patient's history were reviewed and updated as appropriate: allergies, current medications, family history, past medical history, social history, past surgical history and problem list.  Review of Systems:  Review of Systems  Constitutional:  Negative for fever and malaise/fatigue.  Gastrointestinal:  Negative for abdominal pain, constipation, diarrhea, nausea and vomiting.  Genitourinary:  Negative for dysuria, frequency and urgency.       Neg - discharge, pelvic pain + vaginal bleeding      Objective:  Physical Exam BP 130/90   Pulse 93   Ht 5' 5.5" (1.664 m)   Wt 238 lb 11.2 oz (108.3 kg)   LMP 04/06/2017 (Approximate)   BMI 39.12 kg/m  Physical Exam Vitals and nursing note reviewed. Exam conducted with a chaperone present.  Constitutional:      General: She is not in acute distress.    Appearance: Normal appearance. She is well-developed and normal weight.  HENT:     Head: Normocephalic and atraumatic.  Neck:     Thyroid: No thyromegaly.  Cardiovascular:     Rate and Rhythm: Normal rate and regular rhythm.     Heart sounds: No murmur heard. Pulmonary:     Effort: Pulmonary effort is normal. No respiratory distress.     Breath sounds: Normal breath sounds. No wheezing.  Abdominal:     General: Abdomen is flat. There is no distension.      Palpations: Abdomen is soft. There is no mass.     Tenderness: There is no abdominal tenderness. There is no guarding or rebound.  Genitourinary:    General: Normal vulva.     Vagina: Bleeding (scant, brown) present. No vaginal discharge, erythema or tenderness.     Cervix: No cervical motion tenderness, discharge, friability, lesion, erythema or cervical bleeding.     Uterus: Not enlarged and not tender.      Adnexa:        Right: No mass or tenderness.         Left: No mass or tenderness.    Musculoskeletal:     Cervical back: Neck supple.  Skin:    General: Skin is warm and dry.     Findings: No erythema.  Neurological:     Mental Status: She is alert and oriented to person, place, and time.  Psychiatric:        Mood and Affect: Mood normal.     Health Maintenance Due  Topic Date Due   COVID-19 Vaccine (3 - Pfizer series) 03/13/2020   Zoster Vaccines- Shingrix (2 of 2) 02/19/2022     Assessment & Plan:  1. Cervical cancer screening - Cytology - PAP( [place])  2. Screening for STD (sexually transmitted disease) - Cytology - PAP( [place]) - HIV Antibody (routine testing w rflx) - [facility] - Hepatitis B Surface AntiGEN - Hepatitis  C Antibody  Patient to return to for evaluation of irregular bleeding possibly including endometrial biopsy first available  Will contact via MyChart with results from today   Marny Lowenstein, PA-C 03/22/2022 12:21 PM

## 2022-03-23 LAB — HEPATITIS C ANTIBODY: Hep C Virus Ab: NONREACTIVE

## 2022-03-23 LAB — HIV ANTIBODY (ROUTINE TESTING W REFLEX): HIV Screen 4th Generation wRfx: NONREACTIVE

## 2022-03-23 LAB — RPR: RPR Ser Ql: NONREACTIVE

## 2022-03-23 LAB — HEPATITIS B SURFACE ANTIGEN: Hepatitis B Surface Ag: NEGATIVE

## 2022-03-28 LAB — CYTOLOGY - PAP
Adequacy: ABSENT
Chlamydia: NEGATIVE
Comment: NEGATIVE
Comment: NEGATIVE
Comment: NEGATIVE
Comment: NEGATIVE
Comment: NEGATIVE
Comment: NORMAL
HPV 16: NEGATIVE
HPV 18 / 45: POSITIVE — AB
High risk HPV: POSITIVE — AB
Neisseria Gonorrhea: NEGATIVE
Trichomonas: NEGATIVE

## 2022-03-29 ENCOUNTER — Ambulatory Visit (INDEPENDENT_AMBULATORY_CARE_PROVIDER_SITE_OTHER): Payer: Managed Care, Other (non HMO)

## 2022-03-29 ENCOUNTER — Telehealth: Payer: Self-pay | Admitting: General Practice

## 2022-03-29 DIAGNOSIS — Z23 Encounter for immunization: Secondary | ICD-10-CM

## 2022-03-29 NOTE — Progress Notes (Signed)
Patient seen in office today and 2nd shingles vaccine given today.

## 2022-03-29 NOTE — Telephone Encounter (Signed)
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 Patient needs Colpo. Does not have MyChart. Already scheduled with [facility] in August for AUB, can we add colpo?   Raynelle Fanning

## 2022-03-29 NOTE — Telephone Encounter (Signed)
Called patient and informed her of results and need for colposcopy. Discussed colposcopy being done at follow up appt on 8/11. Patient verbalized understanding.

## 2022-05-03 ENCOUNTER — Other Ambulatory Visit (HOSPITAL_COMMUNITY)
Admission: RE | Admit: 2022-05-03 | Discharge: 2022-05-03 | Disposition: A | Discharge status: Home / Self Care | Payer: Managed Care, Other (non HMO) | Source: Ambulatory Visit | Attending: Obstetrics and Gynecology | Admitting: Obstetrics and Gynecology

## 2022-05-03 ENCOUNTER — Encounter: Payer: Self-pay | Admitting: Obstetrics and Gynecology

## 2022-05-03 ENCOUNTER — Ambulatory Visit (INDEPENDENT_AMBULATORY_CARE_PROVIDER_SITE_OTHER): Payer: Managed Care, Other (non HMO) | Admitting: Obstetrics and Gynecology

## 2022-05-03 ENCOUNTER — Other Ambulatory Visit: Payer: Self-pay

## 2022-05-03 VITALS — BP 120/83 | HR 80 | Wt 235.4 lb

## 2022-05-03 DIAGNOSIS — R87612 Low grade squamous intraepithelial lesion on cytologic smear of cervix (LGSIL): Secondary | ICD-10-CM

## 2022-05-03 DIAGNOSIS — N950 Postmenopausal bleeding: Secondary | ICD-10-CM | POA: Insufficient documentation

## 2022-05-03 DIAGNOSIS — Z0289 Encounter for other administrative examinations: Secondary | ICD-10-CM

## 2022-05-03 DIAGNOSIS — Z87410 Personal history of cervical dysplasia: Secondary | ICD-10-CM

## 2022-05-03 HISTORY — PX: ENDOMETRIAL BIOPSY: PRO73

## 2022-05-03 LAB — POCT PREGNANCY, URINE: Preg Test, Ur: NEGATIVE

## 2022-05-03 NOTE — Procedures (Addendum)
Colposcopy and Endometrial Biopsy Procedure Note  Pre-operative Diagnosis: Likely postmenopausal bleeding  Post-operative Diagnosis: same  Procedure Details  Urine pregnancy test was done and result was negative.  The risks (including infection, bleeding, pain, and uterine perforation) and benefits of the procedure were explained to the patient and Written informed consent was obtained.  The patient was placed in the dorsal lithotomy position.  Bimanual exam showed the uterus to be in the neutral position.  A Lizama' speculum inserted in the vagina, and the cervix was visualized and a vaginal swab was done.  Acetic acid staining was done and the cervix was viewed with green filter.   Random biopsies from 2, 6 and 10 o'clock done and then single toothed tenaculum applied and endocervical curettage in all four quadrants done.   The cervix was then prepped with povidone iodine, and a sharp tenaculum was applied to the anterior lip of the cervix for stabilization.  A pipelle was inserted into the uterine cavity and sounded the uterus to a depth of 10.5cm.  A Minimal amount of tissue was collected after 3 passes. The sample was sent for pathologic examination.  Findings: normal cervix. No acetic acid or green filter changes  Adequate: No  Specimens: vaginal swab; 2, 6 and 10 o'clock cervical biopsies; endocervial curettage; endometrial biopy  Condition: Stable  Complications: None  Plan: The patient was advised to call for any fever or for prolonged or severe pain or bleeding. She was advised to use OTC analgesics as needed for mild to moderate pain. She was advised to avoid vaginal intercourse for 48 hours or until the bleeding has completely stopped.   Cornelia Copa MD Attending Center for [facility])

## 2022-05-03 NOTE — Progress Notes (Unsigned)
Obstetrics and Gynecology New Patient Evaluation  Appointment Date: 05/03/2022  OBGYN Clinic: [facility]  Primary Care Provider: Georgina Quint  Referring Provider: Vonzella Nipple, PA  Chief Complaint: Cervical colposcopy, AUB  History of Present Illness: Dana Reese is a 56 y.o. African-American G2P2 (Patient's last menstrual period was 04/06/2017 (approximate).), seen for the above chief complaint. Her past medical history is significant for LEEP  09/2016 VAIN  No breast s/s, fevers, chills, chest pain, SOB, nausea, vomiting, abdominal pain, dysuria, hematuria, vaginal itching, dyspareunia, SUI or OAB, diarrhea, constipation, blood in BMs  Interval History: Patient unsure when her LMP. She states that she'll go time where she just has spotting with wiping and then times where she has bleeding like a period.   Review of Systems: {ros; complete:30496}   As Per HPI otherwise negative***  Patient Active Problem List   Diagnosis Date Noted   Degenerative disc disease, cervical 11/16/2017   VAIN II (vaginal intraepithelial neoplasia grade II) 10/31/2016   LGSIL on Pap smear of cervix 09/06/2016   Abnormal Papanicolaou smear of cervix with positive human papilloma virus (HPV) test 10/04/2015   Perimenopause 10/04/2015   Macromastia 07/14/2015   Essential hypertension, benign 10/24/2012   Family history of colon cancer 10/24/2012   Family history of breast cancer in first degree relative 10/24/2012    {Common ambulatory SmartLinks:19316}  Past Medical History:  Past Medical History:  Diagnosis Date   Allergy    ASCUS with positive high risk HPV 05/25/2011   Chlamydia 09/23/2010   Hyperlipidemia    Hypertension 09/23/2005   Symptomatic mammary hypertrophy    VAIN II (vaginal intraepithelial neoplasia grade II) 10/2016    Past Surgical History:  Past Surgical History:  Procedure Laterality Date   Colonoscopy  06/23/2012   normal;  repeat in 10 years; [facility].   COLONOSCOPY     ENDOMETRIAL BIOPSY  05/03/2022   TUBAL LIGATION      Past Obstetrical History:  OB History  Gravida Para Term Preterm AB Living  2 2       2   SAB IAB Ectopic Multiple Live Births               # Outcome Date GA Lbr Len/2nd Weight Sex Delivery Anes PTL Lv  2 Para     F Vag-Spont     1 Para     F Vag-Spont       SVD x ***, Cesarean section x ***  Past Gynecological History: As per HPI. Menarche age *** Periods: *** History of Pap Smear(s): {YES Last pap ***, which was *** History of STI(s): {YES BW:466599} She is currently using {PLAN CONTRACEPTION:313102} for contraception.  History of HRT use: {YES NO:314532} HPV vaccine series complete: {YES/NO/NOT APPLICABLE:20182}  Social History:  Social History   Socioeconomic History   Marital status: Single    Spouse name: Not on file   Number of children: Not on file   Years of education: Not on file   Highest education level: Not on file  Occupational History   Not on file  Tobacco Use   Smoking status: Never   Smokeless tobacco: Never  Vaping Use   Vaping Use: Never used  Substance and Sexual Activity   Alcohol use: No    Alcohol/week: 0.0 standard drinks of alcohol   Drug use: No   Sexual activity: Yes    Partners: Male    Birth control/protection: Surgical  Other Topics Concern  Not on file  Social History Narrative   Marital status: single; dating seriously x 10 years; happy; no abuse      Children: 2 (33, 47)  One grandchild      Lives: with daughter, brother, grandchild      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      Tobacco: none       Alcohol: none      Drugs: none      Exercise:  None in 2018      Sexually activity: yes; history of Chlamydia 2012.        Seatbelt: 100% of time.      Guns: none   Social Determinants of Corporate investment banker Strain: Not on file  Food Insecurity: No Food  Insecurity (09/20/2020)   Hunger Vital Sign    Worried About Running Out of Food in the Last Year: Never true    Ran Out of Food in the Last Year: Never true  Transportation Needs: No Transportation Needs (09/20/2020)   PRAPARE - Administrator, Civil Service (Medical): No    Lack of Transportation (Non-Medical): No  Physical Activity: Not on file  Stress: Not on file  Social Connections: Not on file  Intimate Partner Violence: Not on file    Family History:  Family History  Problem Relation Age of Onset   Colon cancer Mother 31       colon cancer   Cancer Mother        Breast cancer age 57; colon cancer age 74; bladder cancer age 24.   Hypertension Mother    Hyperlipidemia Mother    Breast cancer Mother 82       Breast cancer   Cancer Maternal Grandmother        COLON   Heart disease Maternal Grandfather    Cancer Father 11       colon cancer.   Diabetes Daughter    Esophageal cancer Neg Hx    Liver cancer Neg Hx    Pancreatic cancer Neg Hx    Rectal cancer Neg Hx    Stomach cancer Neg Hx    She *** any female cancers, bleeding or blood clotting disorders.   Health Maintenance:  Mammogram(s): {YES ZT:245809} Date: *** Colonoscopy: {YES XI:338250} Date: *** Flu shot UTD:  {YES/NO/NOT APPLICABLE:20182}  Medications Weyman Pedro had no medications administered during this visit. Current Outpatient Medications  Medication Sig Dispense Refill   Fluocinolone Acetonide Scalp 0.01 % OIL Apply topically daily as needed.     lisinopril-hydrochlorothiazide (ZESTORETIC) 20-12.5 MG tablet Take 1 tablet by mouth daily. 90 tablet 3   rosuvastatin (CRESTOR) 10 MG tablet Take 1 tablet (10 mg total) by mouth daily. (Patient not taking: Reported on 03/22/2022) 90 tablet 3   No current facility-administered medications for this visit.    Allergies Patient has no known allergies.   Physical Exam:  BP 120/83   Pulse 80   Wt 235 lb 6.4 oz (106.8 kg)   LMP  04/06/2017 (Approximate)   BMI 38.58 kg/m  Body mass index is 38.58 kg/m. Weight last year: *** General appearance: Well nourished, well developed female in no acute distress.  Neck:  Supple, normal appearance, and no thyromegaly  Cardiovascular: normal s1 and s2.  No murmurs, rubs or gallops. Respiratory:  Clear to auscultation bilateral. Normal respiratory effort Abdomen: positive bowel sounds and no masses, hernias; diffusely non tender to palpation, non distended  Breasts: {pe breast exam:315056::"breasts appear normal, no suspicious masses, no skin or nipple changes or axillary nodes"}. Neuro/Psych:  Normal mood and affect.  Skin:  Warm and dry.  Lymphatic:  No inguinal lymphadenopathy.   Pelvic exam: {ACTION; IS/IS QIO:96295284} limited by body habitus EGBUS: within normal limits Vagina: within normal limits and with {no/min/mod:60509} blood or discharge in the vault Cervix: normal appearing cervix without tenderness, discharge or lesions. IUD strings *** Uterus:  {Desc; uterus-size & shape:16618} and non tender Adnexa:  {exam; adnexa:12223} Rectovaginal: ***  Laboratory: ***  Radiology: ***  Assessment: ***  Plan: *** 1. Post-menopausal bleeding *** - Cervicovaginal ancillary only( [place]) - Surgical pathology( [place]/ [facility]) - Estradiol - Follicle stimulating hormone - US PELVIC COMPLETE WITH TRANSVAGINAL; Future - Surgical pathology( [facility]/ [facility])  2. LGSIL on Pap smear of cervix *** - Surgical pathology( / [facility])  3. History of cervical dysplasia ***  Orders Placed This Encounter  Procedures   US PELVIC COMPLETE WITH TRANSVAGINAL   Estradiol   Follicle stimulating hormone   Pregnancy, urine POC    RTC ***  Cornelia Copa MD Attending Center for [facility])

## 2022-05-04 LAB — ESTRADIOL: Estradiol: 26.8 pg/mL

## 2022-05-04 LAB — FOLLICLE STIMULATING HORMONE: FSH: 44 m[IU]/mL

## 2022-05-06 LAB — CERVICOVAGINAL ANCILLARY ONLY
Bacterial Vaginitis (gardnerella): NEGATIVE
Candida Glabrata: NEGATIVE
Candida Vaginitis: NEGATIVE
Comment: NEGATIVE
Comment: NEGATIVE
Comment: NEGATIVE

## 2022-05-06 LAB — SURGICAL PATHOLOGY

## 2022-05-08 ENCOUNTER — Encounter: Payer: Self-pay | Admitting: Obstetrics and Gynecology

## 2022-05-08 HISTORY — PX: COLPOSCOPY W/ BIOPSY / CURETTAGE: SUR283

## 2022-05-12 ENCOUNTER — Encounter: Payer: Self-pay | Admitting: Obstetrics and Gynecology

## 2022-05-14 ENCOUNTER — Ambulatory Visit
Admission: RE | Admit: 2022-05-14 | Discharge: 2022-05-14 | Disposition: A | Discharge status: Home / Self Care | Payer: Managed Care, Other (non HMO) | Source: Ambulatory Visit | Attending: Obstetrics and Gynecology | Admitting: Obstetrics and Gynecology

## 2022-05-14 DIAGNOSIS — N950 Postmenopausal bleeding: Secondary | ICD-10-CM | POA: Insufficient documentation

## 2022-05-16 ENCOUNTER — Telehealth: Payer: Self-pay | Admitting: Obstetrics and Gynecology

## 2022-05-16 NOTE — Telephone Encounter (Signed)
GYN Telephone  Called patient d/w her re: biopsy and u/s and I told her it looks endometrial polyps likely on u/s given the findings. I told her I recommend hysteroscopy, d&c which she is amenable to after d/w her re: r/b/a, procedure and discharge details  Request sent for hysteroscopy, d&c, vulvar colposcopy; possible myosure polypectomy, myomectomy  Cornelia Copa MD Attending Center for [facility]) 05/16/2022 Time: [phone]

## 2022-05-22 ENCOUNTER — Other Ambulatory Visit: Payer: Self-pay | Admitting: Obstetrics and Gynecology

## 2022-05-22 DIAGNOSIS — Z01818 Encounter for other preprocedural examination: Secondary | ICD-10-CM

## 2022-05-29 ENCOUNTER — Telehealth: Payer: Self-pay | Admitting: Family Medicine

## 2022-05-29 NOTE — Telephone Encounter (Signed)
Called patient, no answer- left message stating we are trying to reach you to return your phone call, please call us back if you still need assistance.  

## 2022-05-29 NOTE — Telephone Encounter (Signed)
Patient called said she have some questions about her up coming surgery.

## 2022-06-26 ENCOUNTER — Encounter (HOSPITAL_BASED_OUTPATIENT_CLINIC_OR_DEPARTMENT_OTHER): Payer: Self-pay

## 2022-06-26 ENCOUNTER — Telehealth: Payer: Self-pay

## 2022-06-26 ENCOUNTER — Ambulatory Visit (HOSPITAL_BASED_OUTPATIENT_CLINIC_OR_DEPARTMENT_OTHER): Admit: 2022-06-26 | Payer: Managed Care, Other (non HMO) | Admitting: Obstetrics and Gynecology

## 2022-06-26 SURGERY — DILATATION & CURETTAGE/HYSTEROSCOPY WITH MYOSURE
Anesthesia: Choice

## 2022-06-26 NOTE — Telephone Encounter (Signed)
Patient was recently scheduled for surgery, patient opted to cancel and requested to be rescheduled in December.   December physician scheduled now available. Called patient, no answer left voicemail, I let her know I had December's schedule and unfortunately Dr. Ilda Basset is not scheduled to operate in December. I still have time available for him in November if she wished to be scheduled on one of those dates otherwise she will be scheduled in January 2024.

## 2022-07-23 ENCOUNTER — Other Ambulatory Visit: Payer: Self-pay | Admitting: Obstetrics and Gynecology

## 2022-07-23 DIAGNOSIS — Z01818 Encounter for other preprocedural examination: Secondary | ICD-10-CM

## 2022-07-29 ENCOUNTER — Encounter (HOSPITAL_BASED_OUTPATIENT_CLINIC_OR_DEPARTMENT_OTHER): Payer: Self-pay | Admitting: Obstetrics and Gynecology

## 2022-07-30 NOTE — Progress Notes (Signed)
Spoke w/ via phone for pre-op interview--- pt Lab needs dos----    [employer], ekg           Lab results------ no COVID test -----patient states asymptomatic no test needed Arrive at ------- 1115 on 07-31-2022 NPO after MN NO Solid Food.  Clear liquids from MN until--- 1015 Med rec completed Medications to take morning of surgery ----- none Diabetic medication ----- n/a Patient instructed no nail polish to be worn day of surgery Patient instructed to bring photo id and insurance card day of surgery Patient aware to have Driver (ride ) / caregiver  for 24 hours after surgery -- friend Patient Special Instructions ----- n/a Pre-Op special Istructions ----- n/a Patient verbalized understanding of instructions that were given at this phone interview. Patient denies shortness of breath, chest pain, fever, cough at this phone interview.

## 2022-07-31 ENCOUNTER — Encounter (HOSPITAL_BASED_OUTPATIENT_CLINIC_OR_DEPARTMENT_OTHER): Payer: Self-pay | Admitting: Obstetrics and Gynecology

## 2022-07-31 ENCOUNTER — Ambulatory Visit (HOSPITAL_BASED_OUTPATIENT_CLINIC_OR_DEPARTMENT_OTHER)
Admission: RE | Admit: 2022-07-31 | Discharge: 2022-07-31 | Disposition: A | Discharge status: Home / Self Care | Payer: Managed Care, Other (non HMO) | Source: Ambulatory Visit | Attending: Obstetrics and Gynecology | Admitting: Obstetrics and Gynecology

## 2022-07-31 ENCOUNTER — Other Ambulatory Visit: Payer: Self-pay

## 2022-07-31 ENCOUNTER — Encounter (HOSPITAL_BASED_OUTPATIENT_CLINIC_OR_DEPARTMENT_OTHER)
Admission: RE | Disposition: A | Discharge status: Home / Self Care | Payer: Self-pay | Source: Ambulatory Visit | Attending: Obstetrics and Gynecology

## 2022-07-31 ENCOUNTER — Ambulatory Visit (HOSPITAL_BASED_OUTPATIENT_CLINIC_OR_DEPARTMENT_OTHER): Payer: Managed Care, Other (non HMO) | Admitting: Certified Registered Nurse Anesthetist

## 2022-07-31 DIAGNOSIS — N840 Polyp of corpus uteri: Secondary | ICD-10-CM

## 2022-07-31 DIAGNOSIS — N950 Postmenopausal bleeding: Secondary | ICD-10-CM

## 2022-07-31 DIAGNOSIS — Z780 Asymptomatic menopausal state: Secondary | ICD-10-CM | POA: Diagnosis not present

## 2022-07-31 DIAGNOSIS — I10 Essential (primary) hypertension: Secondary | ICD-10-CM | POA: Diagnosis not present

## 2022-07-31 DIAGNOSIS — N891 Moderate vaginal dysplasia: Secondary | ICD-10-CM

## 2022-07-31 DIAGNOSIS — Z87410 Personal history of cervical dysplasia: Secondary | ICD-10-CM | POA: Diagnosis not present

## 2022-07-31 DIAGNOSIS — Z01818 Encounter for other preprocedural examination: Secondary | ICD-10-CM

## 2022-07-31 DIAGNOSIS — Z9889 Other specified postprocedural states: Secondary | ICD-10-CM

## 2022-07-31 HISTORY — DX: Personal history of other diseases of the female genital tract: Z87.42

## 2022-07-31 HISTORY — PX: COLPOSCOPY: SHX161

## 2022-07-31 HISTORY — PX: DILATATION & CURETTAGE/HYSTEROSCOPY WITH MYOSURE: SHX6511

## 2022-07-31 HISTORY — DX: Polyp of corpus uteri: N84.0

## 2022-07-31 HISTORY — DX: Personal history of cervical dysplasia: Z87.410

## 2022-07-31 HISTORY — DX: Presence of spectacles and contact lenses: Z97.3

## 2022-07-31 HISTORY — DX: Abnormal uterine and vaginal bleeding, unspecified: N93.9

## 2022-07-31 LAB — MAGNESIUM: Magnesium: 2.1 mg/dL (ref 1.7–2.4)

## 2022-07-31 LAB — BASIC METABOLIC PANEL
Anion gap: 8 (ref 5–15)
BUN: 14 mg/dL (ref 6–20)
CO2: 25 mmol/L (ref 22–32)
Calcium: 9.7 mg/dL (ref 8.9–10.3)
Chloride: 110 mmol/L (ref 98–111)
Creatinine, Ser: 0.83 mg/dL (ref 0.44–1.00)
GFR, Estimated: >60 mL/min (ref >60)
Glucose, Bld: 86 mg/dL (ref 70–99)
Potassium: 3.6 mmol/L (ref 3.5–5.1)
Sodium: 143 mmol/L (ref 135–145)

## 2022-07-31 LAB — POCT I-STAT, CHEM 8
BUN: 13 mg/dL (ref 6–20)
Calcium, Ion: 1.21 mmol/L (ref 1.15–1.40)
Chloride: 103 mmol/L (ref 98–111)
Creatinine, Ser: 0.9 mg/dL (ref 0.44–1.00)
Glucose, Bld: 87 mg/dL (ref 70–99)
HCT: 38 % (ref 36.0–46.0)
Hemoglobin: 12.9 g/dL (ref 12.0–15.0)
Potassium: 3.6 mmol/L (ref 3.5–5.1)
Sodium: 140 mmol/L (ref 135–145)
TCO2: 24 mmol/L (ref 22–32)

## 2022-07-31 SURGERY — DILATATION & CURETTAGE/HYSTEROSCOPY WITH MYOSURE
Anesthesia: General | Site: Vagina

## 2022-07-31 MED ORDER — ACETIC ACID 5 % SOLN
Status: AC
  Filled 2022-07-31: qty 50

## 2022-07-31 MED ORDER — ONDANSETRON HCL 4 MG/2ML IJ SOLN
INTRAMUSCULAR | Status: DC | PRN
  Administered 2022-07-31: 4 mg via INTRAVENOUS

## 2022-07-31 MED ORDER — HEATING PAD PADS: 1.0000 | MEDICATED_PAD | 0 refills | Status: DC | PRN

## 2022-07-31 MED ORDER — HYDROMORPHONE HCL 1 MG/ML IJ SOLN: 0.2500 mg | INTRAMUSCULAR | Status: DC | PRN

## 2022-07-31 MED ORDER — LIDOCAINE HCL 1 % IJ SOLN
INTRAMUSCULAR | Status: DC | PRN
  Administered 2022-07-31: 20 mL

## 2022-07-31 MED ORDER — SODIUM CHLORIDE 0.9 % IV SOLN: INTRAVENOUS | Status: DC

## 2022-07-31 MED ORDER — ROCURONIUM BROMIDE 10 MG/ML (PF) SYRINGE
PREFILLED_SYRINGE | INTRAVENOUS | Status: AC
  Filled 2022-07-31: qty 10

## 2022-07-31 MED ORDER — PROPOFOL 10 MG/ML IV BOLUS
INTRAVENOUS | Status: AC
  Filled 2022-07-31: qty 20

## 2022-07-31 MED ORDER — SILVER NITRATE-POT NITRATE 75-25 % EX MISC
CUTANEOUS | Status: DC | PRN
  Administered 2022-07-31: 4

## 2022-07-31 MED ORDER — DEXAMETHASONE SODIUM PHOSPHATE 10 MG/ML IJ SOLN
INTRAMUSCULAR | Status: AC
  Filled 2022-07-31: qty 4

## 2022-07-31 MED ORDER — EPHEDRINE SULFATE (PRESSORS) 50 MG/ML IJ SOLN
INTRAMUSCULAR | Status: DC | PRN
  Administered 2022-07-31: 10 mg via INTRAVENOUS

## 2022-07-31 MED ORDER — EPHEDRINE 5 MG/ML INJ
INTRAVENOUS | Status: AC
  Filled 2022-07-31: qty 10

## 2022-07-31 MED ORDER — DIPHENHYDRAMINE HCL 50 MG/ML IJ SOLN
INTRAMUSCULAR | Status: DC | PRN
  Administered 2022-07-31: 12.5 mg via INTRAVENOUS

## 2022-07-31 MED ORDER — PROPOFOL 10 MG/ML IV BOLUS
INTRAVENOUS | Status: DC | PRN
  Administered 2022-07-31: 200 mg via INTRAVENOUS

## 2022-07-31 MED ORDER — LACTATED RINGERS IV SOLN: INTRAVENOUS | Status: DC

## 2022-07-31 MED ORDER — KETOROLAC TROMETHAMINE 30 MG/ML IJ SOLN
INTRAMUSCULAR | Status: DC | PRN
  Administered 2022-07-31: 30 mg via INTRAVENOUS

## 2022-07-31 MED ORDER — ONDANSETRON HCL 4 MG/2ML IJ SOLN
INTRAMUSCULAR | Status: AC
  Filled 2022-07-31: qty 8

## 2022-07-31 MED ORDER — FENTANYL CITRATE (PF) 250 MCG/5ML IJ SOLN
INTRAMUSCULAR | Status: DC | PRN
  Administered 2022-07-31 (×2): 25 ug via INTRAVENOUS
  Administered 2022-07-31: 50 ug via INTRAVENOUS

## 2022-07-31 MED ORDER — SODIUM CHLORIDE 0.9 % IR SOLN
Status: DC | PRN
  Administered 2022-07-31: 3000 mL

## 2022-07-31 MED ORDER — MIDAZOLAM HCL 2 MG/2ML IJ SOLN
INTRAMUSCULAR | Status: DC | PRN
  Administered 2022-07-31: 2 mg via INTRAVENOUS

## 2022-07-31 MED ORDER — LIDOCAINE HCL (PF) 2 % IJ SOLN
INTRAMUSCULAR | Status: AC
  Filled 2022-07-31: qty 20

## 2022-07-31 MED ORDER — PHENYLEPHRINE 80 MCG/ML (10ML) SYRINGE FOR IV PUSH (FOR BLOOD PRESSURE SUPPORT)
PREFILLED_SYRINGE | INTRAVENOUS | Status: DC | PRN
  Administered 2022-07-31: 80 ug via INTRAVENOUS

## 2022-07-31 MED ORDER — IBUPROFEN 200 MG PO TABS: 400.0000 mg | ORAL_TABLET | Freq: Four times a day (QID) | ORAL | 0 refills | Status: AC | PRN

## 2022-07-31 MED ORDER — FENTANYL CITRATE (PF) 250 MCG/5ML IJ SOLN
INTRAMUSCULAR | Status: AC
  Filled 2022-07-31: qty 5

## 2022-07-31 MED ORDER — LIDOCAINE 2% (20 MG/ML) 5 ML SYRINGE
INTRAMUSCULAR | Status: DC | PRN
  Administered 2022-07-31: 80 mg via INTRAVENOUS

## 2022-07-31 MED ORDER — DEXAMETHASONE SODIUM PHOSPHATE 10 MG/ML IJ SOLN
INTRAMUSCULAR | Status: DC | PRN
  Administered 2022-07-31: 10 mg via INTRAVENOUS

## 2022-07-31 MED ORDER — MIDAZOLAM HCL 2 MG/2ML IJ SOLN
INTRAMUSCULAR | Status: AC
  Filled 2022-07-31: qty 2

## 2022-07-31 MED ORDER — PHENYLEPHRINE 80 MCG/ML (10ML) SYRINGE FOR IV PUSH (FOR BLOOD PRESSURE SUPPORT)
PREFILLED_SYRINGE | INTRAVENOUS | Status: AC
  Filled 2022-07-31: qty 20

## 2022-07-31 MED ORDER — KETOROLAC TROMETHAMINE 30 MG/ML IJ SOLN
INTRAMUSCULAR | Status: AC
  Filled 2022-07-31: qty 4

## 2022-07-31 SURGICAL SUPPLY — 19 items
CATH ROBINSON RED A/P 16FR (CATHETERS) ×2 IMPLANT
DEVICE MYOSURE LITE (MISCELLANEOUS) IMPLANT
DEVICE MYOSURE REACH (MISCELLANEOUS) IMPLANT
DILATOR CANAL MILEX (MISCELLANEOUS) IMPLANT
DRSG TELFA 3X8 NADH STRL (GAUZE/BANDAGES/DRESSINGS) ×2 IMPLANT
GAUZE 4X4 16PLY ~~LOC~~+RFID DBL (SPONGE) ×4 IMPLANT
GLOVE BIOGEL PI IND STRL 7.5 (GLOVE) ×2 IMPLANT
GLOVE SURG SS PI 7.0 STRL IVOR (GLOVE) ×2 IMPLANT
GOWN STRL REUS W/TWL XL LVL3 (GOWN DISPOSABLE) ×2 IMPLANT
KIT PROCEDURE FLUENT (KITS) ×2 IMPLANT
MYOSURE XL FIBROID (MISCELLANEOUS)
PACK VAGINAL MINOR WOMEN LF (CUSTOM PROCEDURE TRAY) ×2 IMPLANT
PAD OB MATERNITY 4.3X12.25 (PERSONAL CARE ITEMS) ×2 IMPLANT
PAD PREP 24X48 CUFFED NSTRL (MISCELLANEOUS) ×2 IMPLANT
SEAL ROD LENS SCOPE MYOSURE (ABLATOR) ×2 IMPLANT
SUT VIC AB 2-0 SH 27 (SUTURE)
SUT VIC AB 2-0 SH 27XBRD (SUTURE) IMPLANT
SYSTEM TISS REMOVAL MYOSURE XL (MISCELLANEOUS) IMPLANT
TOWEL OR 17X26 10 PK STRL BLUE (TOWEL DISPOSABLE) ×2 IMPLANT

## 2022-07-31 NOTE — Op Note (Signed)
Operative Note   07/31/2022  PRE-OP DIAGNOSIS: History of VaIN. Postmenopausal bleeding. Polyps on endometrial biopsy   POST-OP DIAGNOSIS: Same   SURGEON: Surgeon(s) and Role:    * Corry Bing, MD - Primary  ASSISTANT:   PROCEDURE: Vulvar colposcopy. Hysteroscopy and Myosure polypectomy.  ANESTHESIA: LMA and paracervicaql block   ESTIMATED BLOOD LOSS: 20mL  DRAINS: I/O cath for 2mL UOP   TOTAL IV FLUIDS: per anesthesia note  SPECIMENS: endometrial polyps  VTE PROPHYLAXIS: SCDs to the bilateral lower extremities  ANTIBIOTICS: not indicated  FLUID DEFICIT:  COMPLICATIONS: none  DISPOSITION: PACU - hemodynamically stable.  CONDITION: stable  FINDINGS: Vulvar colposcopy negative. Hystersocopy showed two endometrial polyps. One small polyp coming from the anterior uterine wall near the fundus and a large one going from just past the internal os to past half way down the endocervical canal. Left tubal ostia normal and right tubal ostia area normal but no os seen. Uterine cavity atrophic but had striations/stranding on the walls and wasn't smooth as is typically seen All of the polyps were removed at the end of the procedure.   PROCEDURE IN DETAIL:  After informed consent was obtained, the patient was taken to the operating room where anesthesia was obtained without difficulty. The patient was positioned in the dorsal lithotomy position in [place] stirrups.  The patient's bladder was catheterized with an in and out foley catheter. Soaked lap pads of acetic acid was then applied to the vulva and introitus and left there for seven minutes. After seven minutes, they were removed and colposcopy performed with no abnormalities noted. The patient was then prepped and draped like normal, and a bi-valved speculum was placed inside the patient's vagina, and the the anterior lip of the cervix was seen and grasped with the tenaculum.  A paracervical block was achieved with 20mL 1%  lidocaine.  The cervix was progressively dilated to a 23French-Pratt dilator.  The hysteroscope was introduced, with the above noted findings and Myosure Polypectomy done without issue.  Excellent hemostasis was noted except at the tenaculum site. A 2-0 vicryl stitch was then placed there for excellent hemostasis.  All instruments were removed, with excellent hemostasis noted throughout.  She was then taken out of dorsal lithotomy. The patient tolerated the procedure well.  Sponge, lap and instrument counts were correct x2.  The patient was taken to recovery room in excellent condition.  Cornelia Copa MD Attending Center for [employer] [facility])

## 2022-07-31 NOTE — H&P (Signed)
Obstetrics & Gynecology Surgical H&P   Date of Surgery: 07/31/2022    Primary OBGYN: [facility]  Reason for Admission: scheduled surgery  History of Present Illness: Dana Reese is a 56 y.o. G2P2 (Patient's last menstrual period was 04/06/2017 (approximate).), with the above CC. PMHx is significant for June 2023 LSIL pap, h/o VaIN and LEEP.   Patient initially seen by me on 05/03/22 for cervical colposcopy and post menopausal bleeding.    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 Ultrasound showed ES of 35mm with some heterogenity  Given history and pathology, plan for hysteroscopy, d&c. Also, VaIN history discovered after her last visit so plan for vulvar colpo today.   ROS: A 12-point review of systems was performed and negative, except as stated in the above HPI.  OBGYN History: As per HPI. OB History  Gravida Para Term Preterm AB Living  2 2       2   SAB IAB Ectopic Multiple Live Births               # Outcome Date GA Lbr Len/2nd Weight Sex Delivery Anes PTL Lv  2 Para     F Vag-Spont     1 Para     F Vag-Spont      Past Medical History: Past Medical History:  Diagnosis Date   Abnormal uterine bleeding (AUB)    Endometrial polyp    History of abnormal cervical Pap smear    w/ positive HPV   History of cervical dysplasia    CIN 1   History of chlamydia 2012   History of vaginal dysplasia 09/2016   s/p in office LEEP;   VAIN II and 08/ 2018  VAIN 2   Hyperlipidemia    Hypertension    Wears glasses     Past Surgical History: Past Surgical History:  Procedure Laterality Date   BREAST REDUCTION SURGERY Bilateral 01/29/2018   @[facility]  by dr d. 03/31/2018   COLONOSCOPY  2013   TUBAL LIGATION Bilateral 1999   PPTL    Family History:  Family History  Problem Relation Age of Onset   Colon cancer Mother 4       colon cancer   Cancer Mother        Breast cancer age  56; colon cancer age 56; bladder cancer age 104.   Hypertension Mother    Hyperlipidemia Mother    Breast cancer Mother 38       Breast cancer   Cancer Maternal Grandmother        COLON   Heart disease Maternal Grandfather    Cancer Father 50       colon cancer.   Diabetes Daughter    Esophageal cancer Neg Hx    Liver cancer Neg Hx    Pancreatic cancer Neg Hx    Rectal cancer Neg Hx    Stomach cancer Neg Hx     Social History:  Social History   Socioeconomic History   Marital status: Single    Spouse name: Not on file   Number of children: Not on file   Years of education: Not on file   Highest education level: Not on file  Occupational History   Not on file  Tobacco Use   Smoking status: Never   Smokeless tobacco: Never  Vaping Use   Vaping Use: Never used  Substance and Sexual Activity   Alcohol use:  Yes    Comment: occasional   Drug use: Never   Sexual activity: Yes    Partners: Male    Birth control/protection: Surgical, Post-menopausal  Other Topics Concern   Not on file  Social History Narrative   Marital status: single; dating seriously x 10 years; happy; no abuse      Children: 2 (33, 106)  One grandchild      Lives: with daughter, brother, grandchild      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      Tobacco: none       Alcohol: none      Drugs: none      Exercise:  None in 2018      Sexually activity: yes; history of Chlamydia 2012.        Seatbelt: 100% of time.      Guns: none   Social Determinants of Corporate investment banker Strain: Not on file  Food Insecurity: No Food Insecurity (09/20/2020)   Hunger Vital Sign    Worried About Running Out of Food in the Last Year: Never true    Ran Out of Food in the Last Year: Never true  Transportation Needs: No Transportation Needs (09/20/2020)   PRAPARE - Administrator, Civil Service (Medical): No    Lack of Transportation (Non-Medical): No   Physical Activity: Not on file  Stress: Not on file  Social Connections: Not on file  Intimate Partner Violence: Not on file    Allergy: No Known Allergies  Current Outpatient Medications: Medications Prior to Admission  Medication Sig Dispense Refill Last Dose   Fluocinolone Acetonide Scalp 0.01 % OIL Apply topically daily as needed.   Past Week   lisinopril-hydrochlorothiazide (ZESTORETIC) 20-12.5 MG tablet Take 1 tablet by mouth daily. (Patient taking differently: Take 1 tablet by mouth daily after lunch.) 90 tablet 3 07/30/2022   Multiple Vitamin (MULTIVITAMIN) capsule Take 1 capsule by mouth daily.   07/30/2022   rosuvastatin (CRESTOR) 10 MG tablet Take 1 tablet (10 mg total) by mouth daily. (Patient not taking: Reported on 07/29/2022) 90 tablet 3 Not Taking     Hospital Medications: Current Facility-Administered Medications  Medication Dose Route Frequency Provider Last Rate Last Admin   0.9 %  sodium chloride infusion   Intravenous Continuous [place] Bing, MD       lactated ringers infusion   Intravenous Continuous Jairo Ben, MD 50 mL/hr at 07/31/22 1206 New Bag at 07/31/22 1206     Physical Exam:  Current Vital Signs 24h Vital Sign Ranges  T 98.1 F (36.7 C) Temp  Avg: 98.1 F (36.7 C)  Min: 98.1 F (36.7 C)  Max: 98.1 F (36.7 C)  BP (!) 140/91 BP  Min: 140/91  Max: 140/91  HR 76 Pulse  Avg: 76  Min: 76  Max: 76  RR 18 Resp  Avg: 18  Min: 18  Max: 18  SaO2 100 % Room Air SpO2  Avg: 100 %  Min: 100 %  Max: 100 %       24 Hour I/O Current Shift I/O  Time Ins Outs No intake/output data recorded. No intake/output data recorded.    Body mass index is 39.28 kg/m. General appearance: Well nourished, well developed female in no acute distress.  Cardiovascular: S1, S2 normal, no murmur, rub or gallop, regular rate and rhythm Respiratory:  Clear to auscultation bilateral. Normal respiratory effort Abdomen: positive bowel sounds and no masses, hernias;  diffusely non tender to palpation, non distended Neuro/Psych:  Normal mood and affect.  Skin:  Warm and dry.  Extremities: no clubbing, cyanosis, or edema.    Laboratory:  Recent Labs  Lab 07/31/22 1209  HGB 12.9  HCT 38.0   Recent Labs  Lab 07/31/22 1209  NA 140  K 3.6  CL 103  BUN 13  CREATININE 0.90  GLUCOSE 87    Imaging:  No new imaging.  Narrative & Impression  CLINICAL DATA:  Postmenopausal bleeding, prior endometrial biopsy 1123 with abnormal results, history of endometrial polyps, postmenopausal since 2018   EXAM: TRANSABDOMINAL AND TRANSVAGINAL ULTRASOUND OF PELVIS   TECHNIQUE: Both transabdominal and transvaginal ultrasound examinations of the pelvis were performed. Transabdominal technique was performed for global imaging of the pelvis including uterus, ovaries, adnexal regions, and pelvic cul-de-sac. It was necessary to proceed with endovaginal exam following the transabdominal exam to visualize the uterus, endometrium, and ovaries.   COMPARISON:  None Available.   FINDINGS: Uterus   Measurements: 11.2 x 6.2 x 8.9 cm = volume: 319 mL. Anteverted. Heterogeneous myometrium. Large mass central upper RIGHT uterus, 6.4 x 5.6 x 5.5 cm consistent with leiomyoma, appears transmural. No additional masses.   Endometrium   Thickness: 13 mm.  Heterogeneous.  Trace fluid.  No definite mass.   Right ovary   Measurements: 2.9 x 0.9 x 1.7 cm = volume: 2.3 mL. Normal morphology without mass   Left ovary   Measurements: 2.5 x 1.4 x 1.8 cm = volume: 3.3 mL. Normal morphology without mass   Other findings   No free pelvic fluid or adnexal masses.   IMPRESSION: Abnormal thickened endometrial complex question 13 mm thick containing minimal fluid.   Potentially some of the heterogeneity and fluid could be the result of recent endometrial biopsy.   No discrete endometrial mass identified; recommend correlation with biopsy results.   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     Electronically Signed   By: Ulyses Southward M.D.   On: 05/14/2022 16:33      Assessment: Dana Reese is a 56 y.o. G2P2 (Patient's last menstrual period was 04/06/2017 (approximate).) here for scheduled surgery; pt doing well  Plan: Plan for vulvar colposcopy and hysteroscopy, d&c, possibly myosure polypectomy   Cornelia Copa MD Attending Center for [facility])

## 2022-07-31 NOTE — Anesthesia Postprocedure Evaluation (Signed)
Anesthesia Post Note  Patient: CHRISTEL BAI  Procedure(s) Performed: DILATATION & CURETTAGE/HYSTEROSCOPY MYOSURE, Vaginal myomectomy, Polypectomy (Vagina ) VULVAR COLPOSCOPY     Patient location during evaluation: PACU Anesthesia Type: General Level of consciousness: awake Pain management: pain level controlled Vital Signs Assessment: post-procedure vital signs reviewed and stable Respiratory status: spontaneous breathing Cardiovascular status: stable Postop Assessment: no apparent nausea or vomiting Anesthetic complications: no   No notable events documented.  Last Vitals:  Vitals:   07/31/22 1400 07/31/22 1415  BP: (!) 138/91 (!) 159/97  Pulse: 84 90  Resp: 13 16  Temp:  (!) 36.4 C  SpO2: 97% 99%    Last Pain:  Vitals:   07/31/22 1415  TempSrc:   PainSc: 0-No pain                 Sharie Amorin

## 2022-07-31 NOTE — Anesthesia Preprocedure Evaluation (Signed)
Anesthesia Evaluation  Patient identified by MRN, date of birth, ID band Patient awake  General Assessment Comment:History noted Dr. Chilton Si  Reviewed: Allergy & Precautions, NPO status , Patient's Chart, lab work & pertinent test results  Airway Mallampati: II       Dental   Pulmonary neg pulmonary ROS   breath sounds clear to auscultation       Cardiovascular hypertension,  Rhythm:Regular Rate:Normal     Neuro/Psych negative neurological ROS     GI/Hepatic negative GI ROS, Neg liver ROS,,,  Endo/Other  negative endocrine ROS    Renal/GU negative Renal ROS     Musculoskeletal  (+) Arthritis ,    Abdominal   Peds  Hematology   Anesthesia Other Findings   Reproductive/Obstetrics                             Anesthesia Physical Anesthesia Plan  ASA: 2  Anesthesia Plan: General   Post-op Pain Management: Tylenol PO (pre-op)*   Induction:   PONV Risk Score and Plan: 3 and Ondansetron, Dexamethasone and Midazolam  Airway Management Planned: LMA  Additional Equipment:   Intra-op Plan:   Post-operative Plan:   Informed Consent: I have reviewed the patients History and Physical, chart, labs and discussed the procedure including the risks, benefits and alternatives for the proposed anesthesia with the patient or authorized representative who has indicated his/her understanding and acceptance.     Dental advisory given  Plan Discussed with: CRNA and Anesthesiologist  Anesthesia Plan Comments:        Anesthesia Quick Evaluation

## 2022-07-31 NOTE — Transfer of Care (Signed)
Immediate Anesthesia Transfer of Care Note  Patient: Dana Reese  Procedure(s) Performed: DILATATION & CURETTAGE/HYSTEROSCOPY MYOSURE, Vaginal myomectomy, Polypectomy (Vagina ) VULVAR COLPOSCOPY  Patient Location: PACU  Anesthesia Type:General  Level of Consciousness: awake, alert , and oriented  Airway & Oxygen Therapy: Patient Spontanous Breathing  Post-op Assessment: Report given to RN and Post -op Vital signs reviewed and stable  Post vital signs: Reviewed and stable  Last Vitals:  Vitals Value Taken Time  BP 136/87 07/31/22 1337  Temp 36.4 C 07/31/22 1337  Pulse 93 07/31/22 1344  Resp 21 07/31/22 1344  SpO2 98 % 07/31/22 1344  Vitals shown include unvalidated device data.  Last Pain:  Vitals:   07/31/22 1337  TempSrc:   PainSc: Asleep      Patients Stated Pain Goal: 7 (07/31/22 1149)  Complications: No notable events documented.

## 2022-07-31 NOTE — Discharge Instructions (Addendum)
DISCHARGE INSTRUCTIONS: HYSTEROSCOPY / ENDOMETRIAL ABLATION The following instructions have been prepared to help you care for yourself upon your return home.  May take Ibuprofen after 7pm today   May take stool softner while taking narcotic pain medication to prevent constipation.  Drink plenty of water.  Personal hygiene:  Use sanitary pads for vaginal drainage, not tampons.  Shower the day after your procedure.  NO tub baths, pools or Jacuzzis for 2-3 weeks.  Wipe front to back after using the bathroom.  Activity and limitations:  Do NOT drive or operate any equipment for 24 hours. The effects of anesthesia are still present and drowsiness may result.  Do NOT rest in bed all day.  Walking is encouraged.  Walk up and down stairs slowly.  You may resume your normal activity in one to two days or as indicated by your physician. Sexual activity: NO intercourse for at least 2 weeks after the procedure, or as indicated by your Doctor.  Diet: Eat a light meal as desired this evening. You may resume your usual diet tomorrow.  Return to Work: You may resume your work activities in one to two days or as indicated by Therapist, sports.  What to expect after your surgery: Expect to have vaginal bleeding/discharge for 2-3 days and spotting for up to 10 days. It is not unusual to have soreness for up to 1-2 weeks. You may have a slight burning sensation when you urinate for the first day. Mild cramps may continue for a couple of days. You may have a regular period in 2-6 weeks.  Call your doctor for any of the following:  Excessive vaginal bleeding or clotting, saturating and changing one pad every hour.  Inability to urinate 6 hours after discharge from hospital.  Pain not relieved by pain medication.  Fever of 100.4 F or greater.  Unusual vaginal discharge or odor.  Post Anesthesia Care Unit [phone]   Post Anesthesia Home Care Instructions  Activity: Get plenty of rest for the  remainder of the day. A responsible adult should stay with you for 24 hours following the procedure.  For the next 24 hours, DO NOT: -Drive a car -Advertising copywriter -Drink alcoholic beverages -Take any medication unless instructed by your physician -Make any legal decisions or sign important papers.  Meals: Start with liquid foods such as gelatin or soup. Progress to regular foods as tolerated. Avoid greasy, spicy, heavy foods. If nausea and/or vomiting occur, drink only clear liquids until the nausea and/or vomiting subsides. Call your physician if vomiting continues.  Special Instructions/Symptoms: Your throat may feel dry or sore from the anesthesia or the breathing tube placed in your throat during surgery. If this causes discomfort, gargle with warm salt water. The discomfort should disappear within 24 hours.

## 2022-07-31 NOTE — Anesthesia Procedure Notes (Signed)
Procedure Name: LMA Insertion Date/Time: 07/31/2022 1:01 PM  Performed by: Dairl Ponder, CRNAPre-anesthesia Checklist: Patient identified, Emergency Drugs available, Suction available and Patient being monitored Patient Re-evaluated:Patient Re-evaluated prior to induction Oxygen Delivery Method: Circle System Utilized Preoxygenation: Pre-oxygenation with 100% oxygen Induction Type: IV induction Ventilation: Mask ventilation without difficulty LMA: LMA inserted LMA Size: 4.0 Number of attempts: 1 Airway Equipment and Method: Bite block Placement Confirmation: positive ETCO2 Tube secured with: Tape Dental Injury: Teeth and Oropharynx as per pre-operative assessment

## 2022-08-01 ENCOUNTER — Encounter (HOSPITAL_BASED_OUTPATIENT_CLINIC_OR_DEPARTMENT_OTHER): Payer: Self-pay | Admitting: Obstetrics and Gynecology

## 2022-08-02 LAB — SURGICAL PATHOLOGY

## 2022-08-29 ENCOUNTER — Ambulatory Visit (INDEPENDENT_AMBULATORY_CARE_PROVIDER_SITE_OTHER): Payer: Managed Care, Other (non HMO) | Admitting: Obstetrics and Gynecology

## 2022-08-29 ENCOUNTER — Other Ambulatory Visit: Payer: Self-pay

## 2022-08-29 ENCOUNTER — Encounter: Payer: Self-pay | Admitting: Obstetrics and Gynecology

## 2022-08-29 VITALS — BP 108/87 | HR 110 | Ht 66.0 in | Wt 238.1 lb

## 2022-08-29 DIAGNOSIS — Z09 Encounter for follow-up examination after completed treatment for conditions other than malignant neoplasm: Secondary | ICD-10-CM

## 2022-08-29 NOTE — Progress Notes (Signed)
Pt reports no pain nor bleeding today.

## 2022-08-29 NOTE — Progress Notes (Signed)
Patient ID: Dana Reese, female   DOB: November 13, 1965, 56 y.o.   MRN: [id number]    POSTOPERATIVE VISIT NOTE   Subjective:     Dana Reese is a 56 y.o. G2P2 who presents to the clinic weeks status post diagnostic hysteroscopy for abnormal uterine bleeding on 07/31/2022. Eating a regular diet without difficulty. The patient is not having any pain.  The following portions of the patient's history were reviewed and updated as appropriate: allergies, current medications, past family history, past medical history, past social history, past surgical history, and problem list.. 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    Objective:    BP 108/87   Pulse (!) 110   Ht 5\' 6"  (1.676 m)   Wt 238 lb 1.6 oz (108 kg)   LMP 04/06/2017 (Approximate)   BMI 38.43 kg/m  General:  alert and cooperative  Abdomen: soft, bowel sounds active, non-tender       Pathology Results: FINAL MICROSCOPIC DIAGNOSIS:   A.  ENDOMETRIUM, POLYPECTOMY:  - Endometrial polyp(s).  No hyperplasia/EIN or malignancy identified.    Assessment:   Doing well postoperatively. Operative findings again reviewed. Pathology report discussed.    Plan:   1. Continue any current medications. 2. Reviewed pathology and to monitor for any return of symptoms  3. Activity restrictions: none 4. Anticipated return to work: not applicable. 5. Follow up as needed   04/08/2017, MD Obstetrician & Gynecologist, [facility] for [facility]

## 2022-10-10 ENCOUNTER — Other Ambulatory Visit: Payer: Self-pay | Admitting: Emergency Medicine

## 2022-10-10 DIAGNOSIS — Z1231 Encounter for screening mammogram for malignant neoplasm of breast: Secondary | ICD-10-CM

## 2022-11-28 ENCOUNTER — Ambulatory Visit: Payer: Managed Care, Other (non HMO)

## 2023-01-01 ENCOUNTER — Encounter: Payer: Self-pay | Admitting: Emergency Medicine

## 2023-01-01 ENCOUNTER — Ambulatory Visit (INDEPENDENT_AMBULATORY_CARE_PROVIDER_SITE_OTHER): Payer: Managed Care, Other (non HMO) | Admitting: Emergency Medicine

## 2023-01-01 VITALS — BP 132/82 | HR 90 | Temp 98.1°F | Ht 66.0 in | Wt 241.0 lb

## 2023-01-01 DIAGNOSIS — Z130 Encounter for screening for diseases of the blood and blood-forming organs and certain disorders involving the immune mechanism: Secondary | ICD-10-CM | POA: Diagnosis not present

## 2023-01-01 DIAGNOSIS — Z13220 Encounter for screening for lipoid disorders: Secondary | ICD-10-CM

## 2023-01-01 DIAGNOSIS — Z0000 Encounter for general adult medical examination without abnormal findings: Secondary | ICD-10-CM | POA: Diagnosis not present

## 2023-01-01 DIAGNOSIS — Z13228 Encounter for screening for other metabolic disorders: Secondary | ICD-10-CM | POA: Diagnosis not present

## 2023-01-01 DIAGNOSIS — Z1329 Encounter for screening for other suspected endocrine disorder: Secondary | ICD-10-CM

## 2023-01-01 DIAGNOSIS — I10 Essential (primary) hypertension: Secondary | ICD-10-CM

## 2023-01-01 DIAGNOSIS — Z1211 Encounter for screening for malignant neoplasm of colon: Secondary | ICD-10-CM

## 2023-01-01 LAB — LIPID PANEL
Cholesterol: 240 mg/dL — ABNORMAL HIGH (ref 0–200)
HDL: 49.5 mg/dL (ref >39.00)
LDL Cholesterol: 163 mg/dL — ABNORMAL HIGH (ref 0–99)
NonHDL: 190.16
Total CHOL/HDL Ratio: 5
Triglycerides: 137 mg/dL (ref 0.0–149.0)
VLDL: 27.4 mg/dL (ref 0.0–40.0)

## 2023-01-01 LAB — CBC WITH DIFFERENTIAL/PLATELET
Basophils Absolute: 0 10*3/uL (ref 0.0–0.1)
Basophils Relative: 0.3 % (ref 0.0–3.0)
Eosinophils Absolute: 0.1 10*3/uL (ref 0.0–0.7)
Eosinophils Relative: 1.7 % (ref 0.0–5.0)
HCT: 36.8 % (ref 36.0–46.0)
Hemoglobin: 12.6 g/dL (ref 12.0–15.0)
Lymphocytes Relative: 32.2 % (ref 12.0–46.0)
Lymphs Abs: 2.8 10*3/uL (ref 0.7–4.0)
MCHC: 34.3 g/dL (ref 30.0–36.0)
MCV: 91 fl (ref 78.0–100.0)
Monocytes Absolute: 0.4 10*3/uL (ref 0.1–1.0)
Monocytes Relative: 4.6 % (ref 3.0–12.0)
Neutro Abs: 5.3 10*3/uL (ref 1.4–7.7)
Neutrophils Relative %: 61.2 % (ref 43.0–77.0)
Platelets: 262 10*3/uL (ref 150.0–400.0)
RBC: 4.04 Mil/uL (ref 3.87–5.11)
RDW: 15.8 % — ABNORMAL HIGH (ref 11.5–15.5)
WBC: 8.7 10*3/uL (ref 4.0–10.5)

## 2023-01-01 LAB — COMPREHENSIVE METABOLIC PANEL
ALT: 8 U/L (ref 0–35)
AST: 17 U/L (ref 0–37)
Albumin: 4.2 g/dL (ref 3.5–5.2)
Alkaline Phosphatase: 63 U/L (ref 39–117)
BUN: 11 mg/dL (ref 6–23)
CO2: 29 mEq/L (ref 19–32)
Calcium: 9.5 mg/dL (ref 8.4–10.5)
Chloride: 104 mEq/L (ref 96–112)
Creatinine, Ser: 0.89 mg/dL (ref 0.40–1.20)
GFR: 71.87 mL/min (ref >60.00)
Glucose, Bld: 94 mg/dL (ref 70–99)
Potassium: 3.8 mEq/L (ref 3.5–5.1)
Sodium: 139 mEq/L (ref 135–145)
Total Bilirubin: 0.5 mg/dL (ref 0.2–1.2)
Total Protein: 7.3 g/dL (ref 6.0–8.3)

## 2023-01-01 LAB — TSH: TSH: 2.24 u[IU]/mL (ref 0.35–5.50)

## 2023-01-01 LAB — HEMOGLOBIN A1C: Hgb A1c MFr Bld: 5.1 % (ref 4.6–6.5)

## 2023-01-01 MED ORDER — LISINOPRIL-HYDROCHLOROTHIAZIDE 20-12.5 MG PO TABS: 1.0000 | ORAL_TABLET | Freq: Every day | ORAL | 3 refills | Status: DC

## 2023-01-01 NOTE — Patient Instructions (Signed)

## 2023-01-01 NOTE — Progress Notes (Signed)
Dana Reese 57 y.o.   Chief Complaint  Patient presents with   Annual Exam    No concerns     HISTORY OF PRESENT ILLNESS: This is a 57 y.o. female here for annual exam. History of hypertension on Zestoretic Overall doing well.  Scheduled for mammogram later this month Has no complaints or medical concerns today.  HPI   Prior to Admission medications   Medication Sig Start Date End Date Taking? Authorizing Provider  Fluocinolone Acetonide Scalp 0.01 % OIL Apply topically daily as needed. 04/25/22  Yes [provider]  ibuprofen (MOTRIN IB) 200 MG tablet Take 2 tablets (400 mg total) by mouth every 6 (six) hours as needed. 07/31/22  Yes [place] Bing, MD  Multiple Vitamin (MULTIVITAMIN) capsule Take 1 capsule by mouth daily.   Yes [provider]  lisinopril-hydrochlorothiazide (ZESTORETIC) 20-12.5 MG tablet Take 1 tablet by mouth daily. 01/01/23 12/27/23  Georgina Quint, MD  rosuvastatin (CRESTOR) 10 MG tablet Take 1 tablet (10 mg total) by mouth daily. Patient not taking: Reported on 01/01/2023 12/26/21   Georgina Quint, MD    No Known Allergies  Patient Active Problem List   Diagnosis Date Noted   Degenerative disc disease, cervical 11/16/2017   VAIN II (vaginal intraepithelial neoplasia grade II) 10/31/2016   LGSIL on Pap smear of cervix 09/06/2016   Endometrial polyp 10/04/2015   Perimenopause 10/04/2015   Macromastia 07/14/2015   Essential hypertension, benign 10/24/2012   Family history of colon cancer 10/24/2012   Family history of breast cancer in first degree relative 10/24/2012    Past Medical History:  Diagnosis Date   Abnormal uterine bleeding (AUB)    Endometrial polyp    History of abnormal cervical Pap smear    w/ positive HPV   History of cervical dysplasia    CIN 1   History of chlamydia 2012   History of vaginal dysplasia 09/2016   s/p in office LEEP;   VAIN II and 08/ 2018  VAIN 2   Hyperlipidemia    Hypertension     Wears glasses     Past Surgical History:  Procedure Laterality Date   BREAST REDUCTION SURGERY Bilateral 01/29/2018   @[facility]  by dr d. Odis Luster   COLONOSCOPY  2013   COLPOSCOPY N/A 07/31/2022   Procedure: VULVAR COLPOSCOPY;  Surgeon: [place] Bing, MD;  Location: [facility] [place];  Service: Gynecology;  Laterality: N/A;   DILATATION & CURETTAGE/HYSTEROSCOPY WITH MYOSURE N/A 07/31/2022   Procedure: DILATATION & CURETTAGE/HYSTEROSCOPY MYOSURE, Vaginal myomectomy, Polypectomy;  Surgeon: [place] Bing, MD;  Location: [facility];  Service: Gynecology;  Laterality: N/A;   TUBAL LIGATION Bilateral 1999   PPTL    Social History   Socioeconomic History   Marital status: Single    Spouse name: Not on file   Number of children: Not on file   Years of education: Not on file   Highest education level: Not on file  Occupational History   Not on file  Tobacco Use   Smoking status: Never   Smokeless tobacco: Never  Vaping Use   Vaping Use: Never used  Substance and Sexual Activity   Alcohol use: Yes    Comment: occasional   Drug use: Never   Sexual activity: Yes    Partners: Male    Birth control/protection: Surgical, Post-menopausal  Other Topics Concern   Not on file  Social History Narrative   Marital status: single; dating seriously x 10 years; happy; no abuse  Children: 2 (33, 18)  One grandchild      Lives: with daughter, brother, grandchild      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      Tobacco: none       Alcohol: none      Drugs: none      Exercise:  None in 2018      Sexually activity: yes; history of Chlamydia 2012.        Seatbelt: 100% of time.      Guns: none   Social Determinants of Corporate investment bankerHealth   Financial Resource Strain: Not on file  Food Insecurity: No Food Insecurity (09/20/2020)   Hunger Vital Sign    Worried About Running Out of Food in the Last Year: Never true    Ran Out of Food in  the Last Year: Never true  Transportation Needs: No Transportation Needs (09/20/2020)   PRAPARE - Administrator, Civil ServiceTransportation    Lack of Transportation (Medical): No    Lack of Transportation (Non-Medical): No  Physical Activity: Not on file  Stress: Not on file  Social Connections: Not on file  Intimate Partner Violence: Not on file    Family History  Problem Relation Age of Onset   Colon cancer Mother [id number]       colon cancer   Cancer Mother        Breast cancer age 359; colon cancer age 58; bladder cancer age 58.   Hypertension Mother    Hyperlipidemia Mother    Breast cancer Mother [phone]       Breast cancer   Cancer Maternal Grandmother        COLON   Heart disease Maternal Grandfather    Cancer Father [phone]       colon cancer.   Diabetes Daughter    Esophageal cancer Neg Hx    Liver cancer Neg Hx    Pancreatic cancer Neg Hx    Rectal cancer Neg Hx    Stomach cancer Neg Hx      Review of Systems  Constitutional: Negative.   HENT: Negative.  Negative for congestion and sore throat.   Respiratory: Negative.  Negative for cough and shortness of breath.   Cardiovascular: Negative.  Negative for chest pain and palpitations.  Gastrointestinal: Negative.  Negative for abdominal pain, diarrhea, nausea and vomiting.  Genitourinary: Negative.  Negative for dysuria and hematuria.  Skin: Negative.  Negative for rash.  Neurological: Negative.  Negative for dizziness and headaches.  All other systems reviewed and are negative.   Vitals:   01/01/23 1520  BP: 132/82  Pulse: 90  Temp: 98.1 F (36.7 C)  SpO2: 96%    Physical Exam Vitals reviewed.  Constitutional:      Appearance: Normal appearance.  HENT:     Head: Normocephalic.     Right Ear: Tympanic membrane, ear canal and external ear normal.     Left Ear: Tympanic membrane, ear canal and external ear normal.     Mouth/Throat:     Mouth: Mucous membranes are moist.     Pharynx: Oropharynx is clear.  Eyes:     Extraocular  Movements: Extraocular movements intact.     Conjunctiva/sclera: Conjunctivae normal.     Pupils: Pupils are equal, round, and reactive to light.  Cardiovascular:     Rate and Rhythm: Normal rate and regular rhythm.     Pulses: Normal pulses.     Heart sounds: Normal heart sounds.  Pulmonary:  Effort: Pulmonary effort is normal.     Breath sounds: Normal breath sounds.  Abdominal:     Palpations: Abdomen is soft.     Tenderness: There is no abdominal tenderness.  Musculoskeletal:     Cervical back: No tenderness.  Lymphadenopathy:     Cervical: No cervical adenopathy.  Skin:    General: Skin is warm and dry.  Neurological:     General: No focal deficit present.     Mental Status: She is alert and oriented to person, place, and time.  Psychiatric:        Mood and Affect: Mood normal.        Behavior: Behavior normal.      ASSESSMENT & PLAN: Problem List Items Addressed This Visit       Cardiovascular and Mediastinum   Essential hypertension, benign   Relevant Medications   lisinopril-hydrochlorothiazide (ZESTORETIC) 20-12.5 MG tablet   Other Visit Diagnoses     Routine general medical examination at a health care facility    -  Primary   Relevant Orders   CBC with Differential   Comprehensive metabolic panel   Hemoglobin A1c   Lipid panel   TSH   Colon cancer screening       Relevant Orders   Cologuard   Screening for deficiency anemia       Relevant Orders   CBC with Differential   Screening for lipoid disorders       Relevant Orders   Lipid panel   Screening for endocrine, metabolic and immunity disorder       Relevant Orders   Comprehensive metabolic panel   Hemoglobin A1c   TSH     Modifiable risk factors discussed with patient. Anticipatory guidance according to age provided. The following topics were also discussed: Social Determinants of Health Smoking.  Non-smoker Diet and nutrition and need to decrease amount of daily carbohydrate intake  and daily calories and increase amount of plant-based protein in her diet. Benefits of exercise Cancer screening and need for colon cancer screening.  Prefers Cologuard.  Scheduled for mammogram later this month Vaccinations reviewed and recommendations Cardiovascular risk assessment and need for blood work Diagnosis of hypertension and cardiovascular risks associated with this condition Review of medications Mental health including depression and anxiety Fall and accident prevention  Patient Instructions  Health Maintenance, Female Adopting a healthy lifestyle and getting preventive care are important in promoting health and wellness. Ask your health care provider about: The right schedule for you to have regular tests and exams. Things you can do on your own to prevent diseases and keep yourself healthy. What should I know about diet, weight, and exercise? Eat a healthy diet  Eat a diet that includes plenty of vegetables, fruits, low-fat dairy products, and lean protein. Do not eat a lot of foods that are high in solid fats, added sugars, or sodium. Maintain a healthy weight Body mass index (BMI) is used to identify weight problems. It estimates body fat based on height and weight. Your health care provider can help determine your BMI and help you achieve or maintain a healthy weight. Get regular exercise Get regular exercise. This is one of the most important things you can do for your health. Most adults should: Exercise for at least 150 minutes each week. The exercise should increase your heart rate and make you sweat (moderate-intensity exercise). Do strengthening exercises at least twice a week. This is in addition to the moderate-intensity exercise. Spend less time sitting.  Even light physical activity can be beneficial. Watch cholesterol and blood lipids Have your blood tested for lipids and cholesterol at 58 years of age, then have this test every 5 years. Have your cholesterol  levels checked more often if: Your lipid or cholesterol levels are high. You are older than 58 years of age. You are at high risk for heart disease. What should I know about cancer screening? Depending on your health history and family history, you may need to have cancer screening at various ages. This may include screening for: Breast cancer. Cervical cancer. Colorectal cancer. Skin cancer. Lung cancer. What should I know about heart disease, diabetes, and high blood pressure? Blood pressure and heart disease High blood pressure causes heart disease and increases the risk of stroke. This is more likely to develop in people who have high blood pressure readings or are overweight. Have your blood pressure checked: Every 3-5 years if you are 74-52 years of age. Every year if you are 99 years old or older. Diabetes Have regular diabetes screenings. This checks your fasting blood sugar level. Have the screening done: Once every three years after age 57 if you are at a normal weight and have a low risk for diabetes. More often and at a younger age if you are overweight or have a high risk for diabetes. What should I know about preventing infection? Hepatitis B If you have a higher risk for hepatitis B, you should be screened for this virus. Talk with your health care provider to find out if you are at risk for hepatitis B infection. Hepatitis C Testing is recommended for: Everyone born from [birth year] through 1966. Anyone with known risk factors for hepatitis C. Sexually transmitted infections (STIs) Get screened for STIs, including gonorrhea and chlamydia, if: You are sexually active and are younger than 58 years of age. You are older than 58 years of age and your health care provider tells you that you are at risk for this type of infection. Your sexual activity has changed since you were last screened, and you are at increased risk for chlamydia or gonorrhea. Ask your health care provider if  you are at risk. Ask your health care provider about whether you are at high risk for HIV. Your health care provider may recommend a prescription medicine to help prevent HIV infection. If you choose to take medicine to prevent HIV, you should first get tested for HIV. You should then be tested every 3 months for as long as you are taking the medicine. Pregnancy If you are about to stop having your period (premenopausal) and you may become pregnant, seek counseling before you get pregnant. Take 400 to 800 micrograms (mcg) of folic acid every day if you become pregnant. Ask for birth control (contraception) if you want to prevent pregnancy. Osteoporosis and menopause Osteoporosis is a disease in which the bones lose minerals and strength with aging. This can result in bone fractures. If you are 17 years old or older, or if you are at risk for osteoporosis and fractures, ask your health care provider if you should: Be screened for bone loss. Take a calcium or vitamin D supplement to lower your risk of fractures. Be given hormone replacement therapy (HRT) to treat symptoms of menopause. Follow these instructions at home: Alcohol use Do not drink alcohol if: Your health care provider tells you not to drink. You are pregnant, may be pregnant, or are planning to become pregnant. If you drink alcohol: Limit how  much you have to: 0-1 drink a day. Know how much alcohol is in your drink. In the [place] one drink equals one 12 oz bottle of beer (355 mL), one 5 oz glass of wine (148 mL), or one 1 oz glass of hard liquor (44 mL). Lifestyle Do not use any products that contain nicotine or tobacco. These products include cigarettes, chewing tobacco, and vaping devices, such as e-cigarettes. If you need help quitting, ask your health care provider. Do not use street drugs. Do not share needles. Ask your health care provider for help if you need support or information about quitting drugs. General  instructions Schedule regular health, dental, and eye exams. Stay current with your vaccines. Tell your health care provider if: You often feel depressed. You have ever been abused or do not feel safe at home. Summary Adopting a healthy lifestyle and getting preventive care are important in promoting health and wellness. Follow your health care provider's instructions about healthy diet, exercising, and getting tested or screened for diseases. Follow your health care provider's instructions on monitoring your cholesterol and blood pressure. This information is not intended to replace advice given to you by your health care provider. Make sure you discuss any questions you have with your health care provider. Document Revised: 01/29/2021 Document Reviewed: 01/29/2021 Elsevier Patient Education  2023 Elsevier Inc.     Edwina Barth, MD [facility]

## 2023-01-10 ENCOUNTER — Ambulatory Visit: Payer: Managed Care, Other (non HMO)

## 2023-01-13 ENCOUNTER — Ambulatory Visit
Admission: RE | Admit: 2023-01-13 | Discharge: 2023-01-13 | Discharge status: Home / Self Care | Payer: Managed Care, Other (non HMO) | Source: Ambulatory Visit | Attending: Emergency Medicine

## 2023-01-13 DIAGNOSIS — Z1231 Encounter for screening mammogram for malignant neoplasm of breast: Secondary | ICD-10-CM

## 2023-01-18 LAB — COLOGUARD: COLOGUARD: POSITIVE — AB

## 2023-01-20 ENCOUNTER — Other Ambulatory Visit: Payer: Self-pay | Admitting: Emergency Medicine

## 2023-01-20 DIAGNOSIS — R195 Other fecal abnormalities: Secondary | ICD-10-CM

## 2023-01-29 ENCOUNTER — Encounter: Payer: Self-pay | Admitting: Internal Medicine

## 2023-02-04 ENCOUNTER — Other Ambulatory Visit: Payer: Self-pay | Admitting: Emergency Medicine

## 2023-02-04 DIAGNOSIS — I10 Essential (primary) hypertension: Secondary | ICD-10-CM

## 2023-02-06 ENCOUNTER — Encounter: Payer: Self-pay | Admitting: Internal Medicine

## 2023-02-06 ENCOUNTER — Ambulatory Visit (AMBULATORY_SURGERY_CENTER): Payer: Managed Care, Other (non HMO)

## 2023-02-06 VITALS — Ht 66.0 in | Wt 240.0 lb

## 2023-02-06 DIAGNOSIS — Z800 Family history of malignant neoplasm of digestive organs: Secondary | ICD-10-CM

## 2023-02-06 DIAGNOSIS — R195 Other fecal abnormalities: Secondary | ICD-10-CM

## 2023-02-06 MED ORDER — NA SULFATE-K SULFATE-MG SULF 17.5-3.13-1.6 GM/177ML PO SOLN: 1.0000 | Freq: Once | ORAL | 0 refills | Status: AC

## 2023-02-06 NOTE — Progress Notes (Signed)
No egg or soy allergy known to patient  No issues known to pt with past sedation with any surgeries or procedures Patient denies ever being told they had issues or difficulty with intubation  No FH of Malignant Hyperthermia Pt is not on diet pills Pt is not on  home 02  Pt is not on blood thinners  Pt denies issues with constipation  No A fib or A flutter Have any cardiac testing pending--no  Pt is ambulatory   PV completed with patient. Pt is coming to the office to pick up her instructions. Rx sent to [facility]. Good rx coupon provided,.

## 2023-02-07 ENCOUNTER — Telehealth: Payer: Self-pay

## 2023-02-07 NOTE — Telephone Encounter (Signed)
Pt procedure scheduled for 5/22. Pt was suppose to pick up prep instructions yesterday. Attempted to contact patient to see if she would be coming today. Pt advised to come and pick up her instructions by Monday or it may be better for her to r/s when she has the proper time to prep.

## 2023-02-07 NOTE — Telephone Encounter (Signed)
Patient called and stated she did come and pick up her prep instructions yesterday. Stated someone else printed and gave them to her.

## 2023-02-12 ENCOUNTER — Encounter: Payer: Self-pay | Admitting: Internal Medicine

## 2023-02-12 ENCOUNTER — Ambulatory Visit: Payer: Managed Care, Other (non HMO) | Admitting: Internal Medicine

## 2023-02-12 VITALS — BP 112/81 | HR 78 | Temp 97.7°F | Resp 14 | Ht 66.0 in | Wt 240.0 lb

## 2023-02-12 DIAGNOSIS — D124 Benign neoplasm of descending colon: Secondary | ICD-10-CM

## 2023-02-12 DIAGNOSIS — Z1211 Encounter for screening for malignant neoplasm of colon: Secondary | ICD-10-CM | POA: Diagnosis present

## 2023-02-12 DIAGNOSIS — D122 Benign neoplasm of ascending colon: Secondary | ICD-10-CM

## 2023-02-12 DIAGNOSIS — R195 Other fecal abnormalities: Secondary | ICD-10-CM

## 2023-02-12 DIAGNOSIS — Z800 Family history of malignant neoplasm of digestive organs: Secondary | ICD-10-CM

## 2023-02-12 DIAGNOSIS — K635 Polyp of colon: Secondary | ICD-10-CM | POA: Diagnosis not present

## 2023-02-12 MED ORDER — SODIUM CHLORIDE 0.9 % IV SOLN: 500.0000 mL | Freq: Once | INTRAVENOUS | Status: DC

## 2023-02-12 NOTE — Progress Notes (Signed)
Report to PACU, RN, vss, BBS= Clear.  

## 2023-02-12 NOTE — Progress Notes (Signed)
Called to room to assist during endoscopic procedure.  Patient ID and intended procedure confirmed with present staff. Received instructions for my participation in the procedure from the performing physician.  

## 2023-02-12 NOTE — Progress Notes (Signed)
HISTORY OF PRESENT ILLNESS:  Dana Reese is a 57 y.o. female sent today for colonoscopy due to positive Cologuard testing.  Multiple relatives with colon cancer (father, mother, grandmother).  No complaints  REVIEW OF SYSTEMS:  All non-GI ROS negative except for  Past Medical History:  Diagnosis Date   Abnormal uterine bleeding (AUB)    Endometrial polyp    History of abnormal cervical Pap smear    w/ positive HPV   History of cervical dysplasia    CIN 1   History of chlamydia 2012   History of vaginal dysplasia 09/2016   s/p in office LEEP;   VAIN II and 08/ 2018  VAIN 2   Hyperlipidemia    Hypertension    Wears glasses     Past Surgical History:  Procedure Laterality Date   BREAST REDUCTION SURGERY Bilateral 01/29/2018   @[facility]  by dr d. Odis Luster   COLONOSCOPY  2013   COLPOSCOPY N/A 07/31/2022   Procedure: VULVAR COLPOSCOPY;  Surgeon: [place] Bing, MD;  Location: [facility];  Service: Gynecology;  Laterality: N/A;   DILATATION & CURETTAGE/HYSTEROSCOPY WITH MYOSURE N/A 07/31/2022   Procedure: DILATATION & CURETTAGE/HYSTEROSCOPY MYOSURE, Vaginal myomectomy, Polypectomy;  Surgeon: [place] Bing, MD;  Location: [facility];  Service: Gynecology;  Laterality: N/A;   REDUCTION MAMMAPLASTY  2019   TUBAL LIGATION Bilateral 1999   PPTL    Social History RYSTAL HOLT  reports that she has never smoked. She has never used smokeless tobacco. She reports current alcohol use. She reports that she does not use drugs.  family history includes Breast cancer (age of onset: 35) in her mother; Cancer in her maternal grandmother and mother; Cancer (age of onset: 47) in her father; Colon cancer in her father; Colon cancer (age of onset: 51) in her mother; Diabetes in her daughter; Heart disease in her maternal grandfather; Hyperlipidemia in her mother; Hypertension in her mother.  No Known Allergies     PHYSICAL EXAMINATION: Vital signs: BP 116/70    Pulse 90   Temp 97.7 F (36.5 C)   Ht 5\' 6"  (1.676 m)   Wt 240 lb (108.9 kg)   LMP 04/06/2017 (Approximate)   SpO2 99%   BMI 38.74 kg/m  General: Well-developed, well-nourished, no acute distress HEENT: Sclerae are anicteric, conjunctiva pink. Oral mucosa intact Lungs: Clear Heart: Regular Abdomen: soft, nontender, nondistended, no obvious ascites, no peritoneal signs, normal bowel sounds. No organomegaly. Extremities: No edema Psychiatric: alert and oriented x3. Cooperative     ASSESSMENT:  Positive Cologuard testing Family history of colon cancer  PLAN:   Colonoscopy

## 2023-02-12 NOTE — Op Note (Signed)
[facility] Patient Name: Dana Reese Procedure Date: 02/12/2023 2:56 PM MRN: [id number] Endoscopist: Wilhemina Bonito. Marina Goodell , MD, [id number] Age: 57 Referring MD:  Date of Birth: Jun 16, 1966 Gender: Female Account #: [id number] Procedure:                Colonoscopy with cold snare polypectomy x 2 Indications:              Screening for colorectal malignant neoplasm. Strong                            family history of colon cancer (mother, father,                            grandparent). Recent positive Cologuard testing.                            Prior colonoscopy in 2012 was normal Medicines:                Monitored Anesthesia Care Procedure:                Pre-Anesthesia Assessment:                           - Prior to the procedure, a History and Physical                            was performed, and patient medications and                            allergies were reviewed. The patient's tolerance of                            previous anesthesia was also reviewed. The risks                            and benefits of the procedure and the sedation                            options and risks were discussed with the patient.                            All questions were answered, and informed consent                            was obtained. Prior Anticoagulants: The patient has                            taken no anticoagulant or antiplatelet agents. ASA                            Grade Assessment: II - A patient with mild systemic                            disease. After reviewing the risks and benefits,  the patient was deemed in satisfactory condition to                            undergo the procedure.                           After obtaining informed consent, the colonoscope                            was passed under direct vision. Throughout the                            procedure, the patient's blood pressure, pulse, and                             oxygen saturations were monitored continuously. The                            CF [id number] #[id number] was introduced through the anus                            and advanced to the the cecum, identified by                            appendiceal orifice and ileocecal valve. The                            ileocecal valve, appendiceal orifice, and rectum                            were photographed. The quality of the bowel                            preparation was excellent. The colonoscopy was                            performed without difficulty. The patient tolerated                            the procedure well. The bowel preparation used was                            SUPREP via split dose instruction. Scope In: 3:10:56 PM Scope Out: 3:25:15 PM Scope Withdrawal Time: 0 hours 10 minutes 45 seconds  Total Procedure Duration: 0 hours 14 minutes 19 seconds  Findings:                 Two polyps were found in the descending colon and                            ascending colon. The polyps were 3 to 4 mm in size.                            These polyps were removed with a  cold snare.                            Resection and retrieval were complete.                           Multiple diverticula were found in the left colon.                           Internal hemorrhoids were found during                            retroflexion. The hemorrhoids were small.                           The exam was otherwise without abnormality on                            direct and retroflexion views. Complications:            No immediate complications. Estimated blood loss:                            None. Estimated Blood Loss:     Estimated blood loss: none. Impression:               - Two 3 to 4 mm polyps in the descending colon and                            in the ascending colon, removed with a cold snare.                            Resected and retrieved.                           - Diverticulosis  in the left colon.                           - Internal hemorrhoids.                           - The examination was otherwise normal on direct                            and retroflexion views. Recommendation:           - Repeat colonoscopy in 5 years for surveillance                            (family history).                           - Patient has a contact number available for                            emergencies. The signs and symptoms of potential  delayed complications were discussed with the                            patient. Return to normal activities tomorrow.                            Written discharge instructions were provided to the                            patient.                           - Resume previous diet.                           - Continue present medications.                           - Await pathology results. Wilhemina Bonito. Marina Goodell, MD 02/12/2023 3:31:11 PM This report has been signed electronically.

## 2023-02-12 NOTE — Progress Notes (Signed)
Pt's states no medical or surgical changes since previsit or office visit. 

## 2023-02-12 NOTE — Patient Instructions (Signed)
Resume all of your regular medications today as ordered.  YOU HAD AN ENDOSCOPIC PROCEDURE TODAY AT THE [facility]:   Refer to the procedure report that was given to you for any specific questions about what was found during the examination.  If the procedure report does not answer your questions, please call your gastroenterologist to clarify.  If you requested that your care partner not be given the details of your procedure findings, then the procedure report has been included in a sealed envelope for you to review at your convenience later.  YOU SHOULD EXPECT: Some feelings of bloating in the abdomen. Passage of more gas than usual.  Walking can help get rid of the air that was put into your GI tract during the procedure and reduce the bloating. If you had a lower endoscopy (such as a colonoscopy or flexible sigmoidoscopy) you may notice spotting of blood in your stool or on the toilet paper. If you underwent a bowel prep for your procedure, you may not have a normal bowel movement for a few days.  Please Note:  You might notice some irritation and congestion in your nose or some drainage.  This is from the oxygen used during your procedure.  There is no need for concern and it should clear up in a day or so.  SYMPTOMS TO REPORT IMMEDIATELY:  Following lower endoscopy (colonoscopy or flexible sigmoidoscopy):  Excessive amounts of blood in the stool  Significant tenderness or worsening of abdominal pains  Swelling of the abdomen that is new, acute  Fever of 100F or higher   For urgent or emergent issues, a gastroenterologist can be reached at any hour by calling [phone]. Do not use MyChart messaging for urgent concerns.    DIET:  We do recommend a small meal at first, but then you may proceed to your regular diet.  Drink plenty of fluids but you should avoid alcoholic beverages for 24 hours.  Try to increase the fiber in your diet, and drink plenty of water.  ACTIVITY:   You should plan to take it easy for the rest of today and you should NOT DRIVE or use heavy machinery until tomorrow (because of the sedation medicines used during the test).    FOLLOW UP: Our staff will call the number listed on your records the next business day following your procedure.  We will call around 7:15- 8:00 am to check on you and address any questions or concerns that you may have regarding the information given to you following your procedure. If we do not reach you, we will leave a message.     If any biopsies were taken you will be contacted by phone or by letter within the next 1-3 weeks.  Please call us at [phone] if you have not heard about the biopsies in 3 weeks.    SIGNATURES/CONFIDENTIALITY: You and/or your care partner have signed paperwork which will be entered into your electronic medical record.  These signatures attest to the fact that that the information above on your After Visit Summary has been reviewed and is understood.  Full responsibility of the confidentiality of this discharge information lies with you and/or your care-partner.

## 2023-02-13 ENCOUNTER — Telehealth: Payer: Self-pay | Admitting: *Deleted

## 2023-02-13 NOTE — Telephone Encounter (Signed)
  Follow up Call-     02/12/2023    2:17 PM  Call back number  Post procedure Call Back phone  # [phone]  Permission to leave phone message Yes     Patient questions:  Do you have a fever, pain , or abdominal swelling? No. Pain Score  0 *  Have you tolerated food without any problems? Yes.    Have you been able to return to your normal activities? Yes.    Do you have any questions about your discharge instructions: Diet   No. Medications  No. Follow up visit  No.  Do you have questions or concerns about your Care? No.  Actions: * If pain score is 4 or above: No action needed, pain <4.

## 2023-02-18 ENCOUNTER — Encounter: Payer: Self-pay | Admitting: Internal Medicine

## 2023-03-14 ENCOUNTER — Encounter: Payer: Managed Care, Other (non HMO) | Admitting: Internal Medicine

## 2023-06-27 ENCOUNTER — Other Ambulatory Visit (HOSPITAL_COMMUNITY): Payer: Self-pay

## 2023-06-27 MED ORDER — WEGOVY 0.25 MG/0.5ML ~~LOC~~ SOAJ
0.2500 mg | SUBCUTANEOUS | 0 refills | Status: DC
  Filled 2023-06-27 – 2023-06-28 (×2): qty 2, 28d supply, fill #0

## 2023-06-28 ENCOUNTER — Other Ambulatory Visit (HOSPITAL_COMMUNITY): Payer: Self-pay

## 2023-07-04 ENCOUNTER — Other Ambulatory Visit (HOSPITAL_COMMUNITY): Payer: Self-pay

## 2023-07-31 ENCOUNTER — Other Ambulatory Visit (HOSPITAL_COMMUNITY): Payer: Self-pay

## 2023-08-01 ENCOUNTER — Other Ambulatory Visit (HOSPITAL_COMMUNITY): Payer: Self-pay

## 2023-08-01 MED ORDER — WEGOVY 0.25 MG/0.5ML ~~LOC~~ SOAJ
0.2500 mg | SUBCUTANEOUS | 0 refills | Status: DC
  Filled 2023-08-01: qty 2, 28d supply, fill #0

## 2023-08-08 ENCOUNTER — Other Ambulatory Visit (HOSPITAL_COMMUNITY): Payer: Self-pay

## 2023-08-08 MED ORDER — WEGOVY 0.25 MG/0.5ML ~~LOC~~ SOAJ
0.2500 mg | SUBCUTANEOUS | 0 refills | Status: DC
  Filled 2023-08-23: qty 2, 28d supply, fill #0

## 2023-08-23 ENCOUNTER — Other Ambulatory Visit (HOSPITAL_COMMUNITY): Payer: Self-pay

## 2023-09-06 ENCOUNTER — Other Ambulatory Visit (HOSPITAL_COMMUNITY): Payer: Self-pay

## 2023-09-06 MED ORDER — WEGOVY 0.25 MG/0.5ML ~~LOC~~ SOAJ
0.2500 mg | SUBCUTANEOUS | 0 refills | Status: AC
  Filled 2023-09-06 – 2023-11-03 (×2): qty 2, 28d supply, fill #0

## 2023-11-03 ENCOUNTER — Other Ambulatory Visit (HOSPITAL_COMMUNITY): Payer: Self-pay

## 2023-11-04 ENCOUNTER — Other Ambulatory Visit (HOSPITAL_COMMUNITY): Payer: Self-pay

## 2023-11-05 ENCOUNTER — Other Ambulatory Visit (HOSPITAL_COMMUNITY): Payer: Self-pay

## 2023-11-05 MED ORDER — WEGOVY 0.25 MG/0.5ML ~~LOC~~ SOAJ
0.5000 mg | SUBCUTANEOUS | 0 refills | Status: AC
  Filled 2023-11-05: qty 2, 28d supply, fill #0

## 2023-11-06 ENCOUNTER — Other Ambulatory Visit (HOSPITAL_COMMUNITY): Payer: Self-pay

## 2023-11-06 MED ORDER — WEGOVY 0.5 MG/0.5ML ~~LOC~~ SOAJ
0.5000 mg | SUBCUTANEOUS | 3 refills | Status: AC
  Filled 2023-11-06: qty 2, 28d supply, fill #0
  Filled 2024-04-03: qty 2, 28d supply, fill #1

## 2023-11-06 MED ORDER — WEGOVY 0.5 MG/0.5ML ~~LOC~~ SOAJ
1.0000 mL | SUBCUTANEOUS | 3 refills | Status: AC
  Filled 2023-11-06: qty 2, 28d supply, fill #0

## 2023-11-13 ENCOUNTER — Other Ambulatory Visit (HOSPITAL_COMMUNITY): Payer: Self-pay

## 2023-11-15 ENCOUNTER — Other Ambulatory Visit (HOSPITAL_COMMUNITY): Payer: Self-pay

## 2023-12-10 ENCOUNTER — Other Ambulatory Visit: Payer: Self-pay | Admitting: Emergency Medicine

## 2023-12-10 DIAGNOSIS — Z1231 Encounter for screening mammogram for malignant neoplasm of breast: Secondary | ICD-10-CM

## 2024-01-08 IMAGING — MG MM DIGITAL SCREENING BILAT W/ TOMO AND CAD
6 of 10 series · 6 of 30 positions shown · non-contrast
Comparison: Previous exam(s).

ACR Breast Density Category a: The breast tissue is almost entirely
fatty.

CLINICAL DATA: Screening.

EXAM:
DIGITAL SCREENING BILATERAL MAMMOGRAM WITH TOMOSYNTHESIS AND CAD
TECHNIQUE: Bilateral screening digital craniocaudal and mediolateral oblique
mammograms were obtained. Bilateral screening digital breast
tomosynthesis was performed. The images were evaluated with
computer-aided detection.

[L MLO synth-2D]
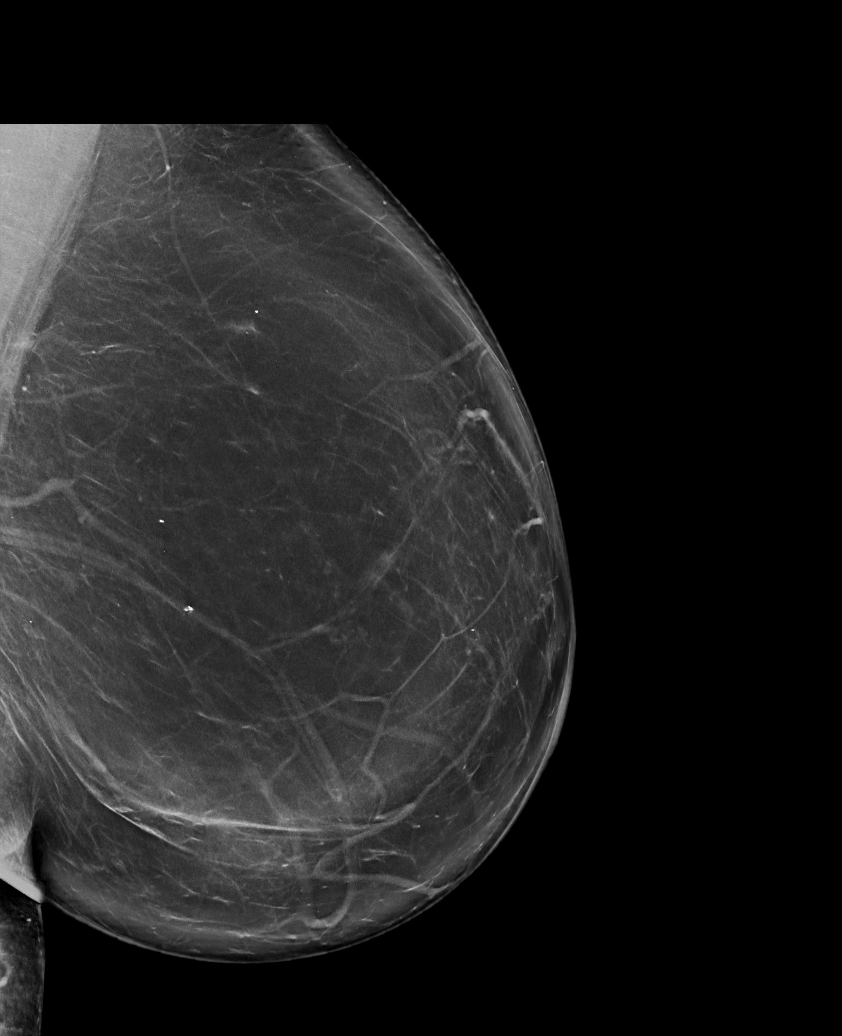

[R MLO synth-2D]
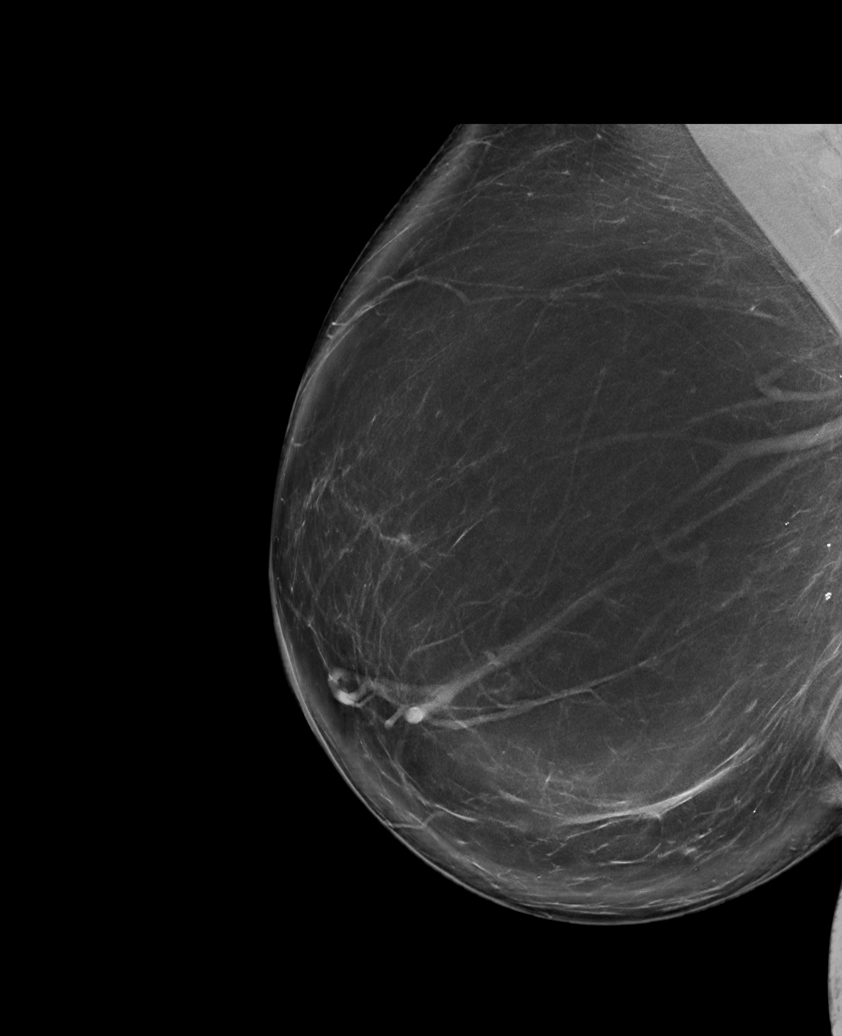

[R CC synth-2D]
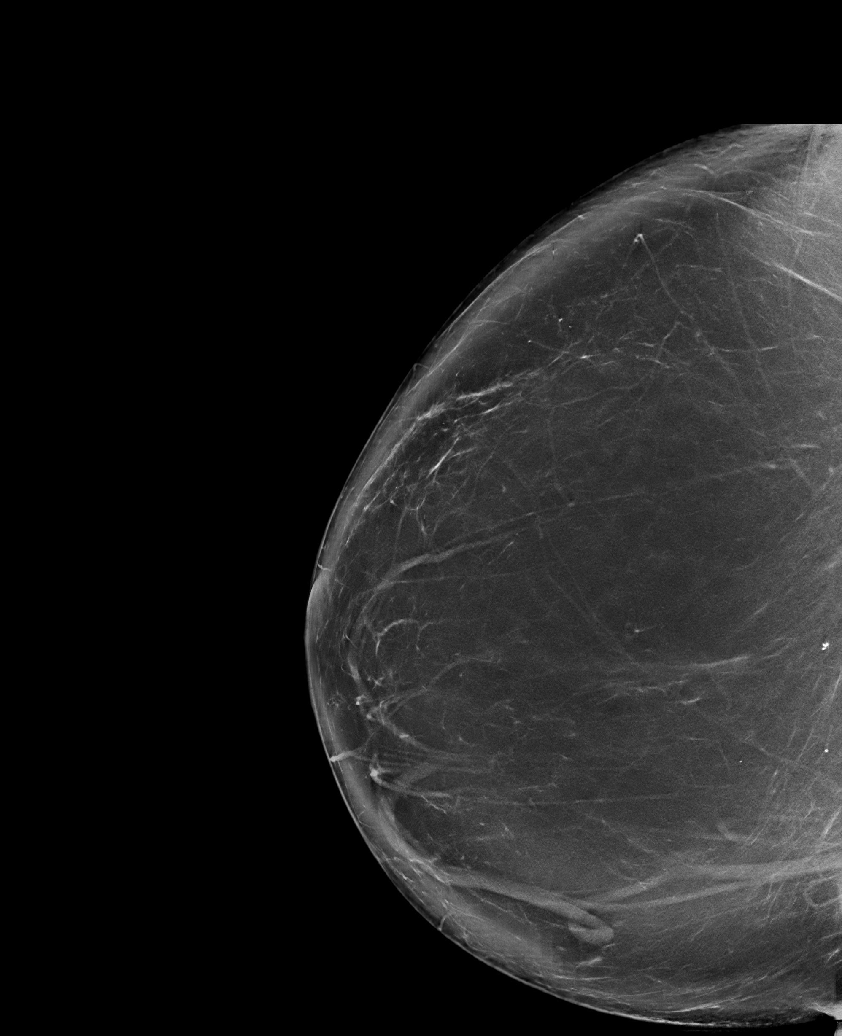

[R CV synth-2D]
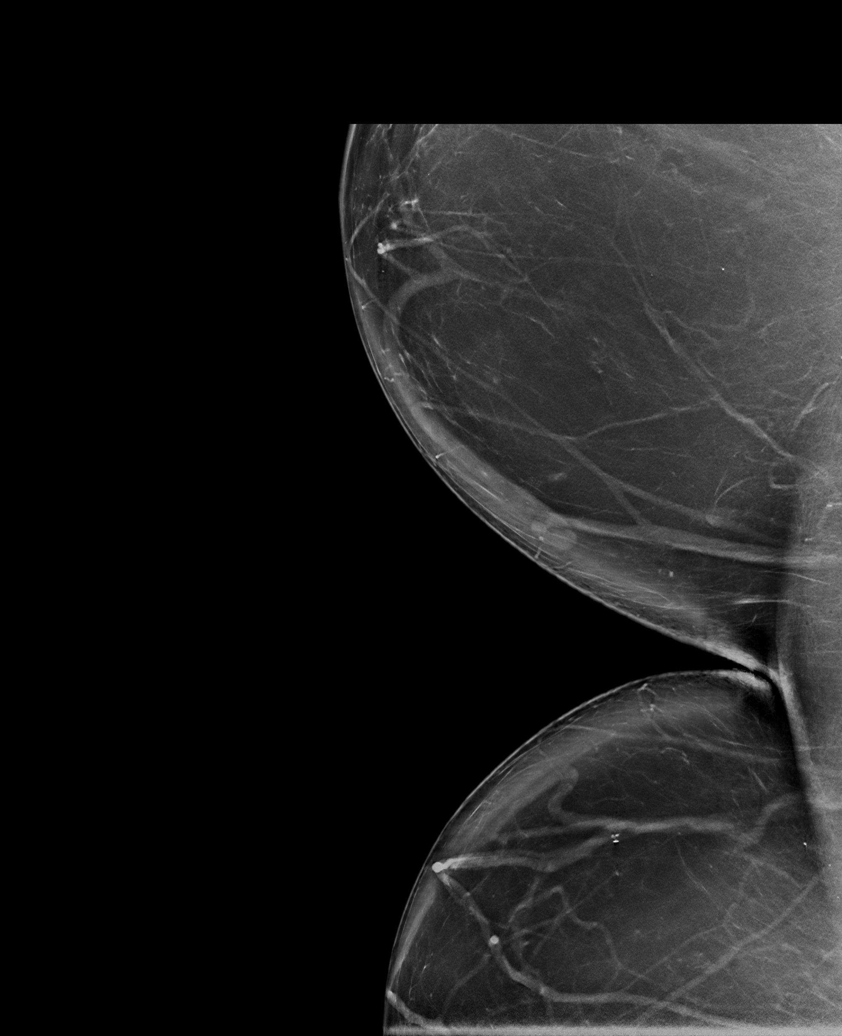

[L CC synth-2D]
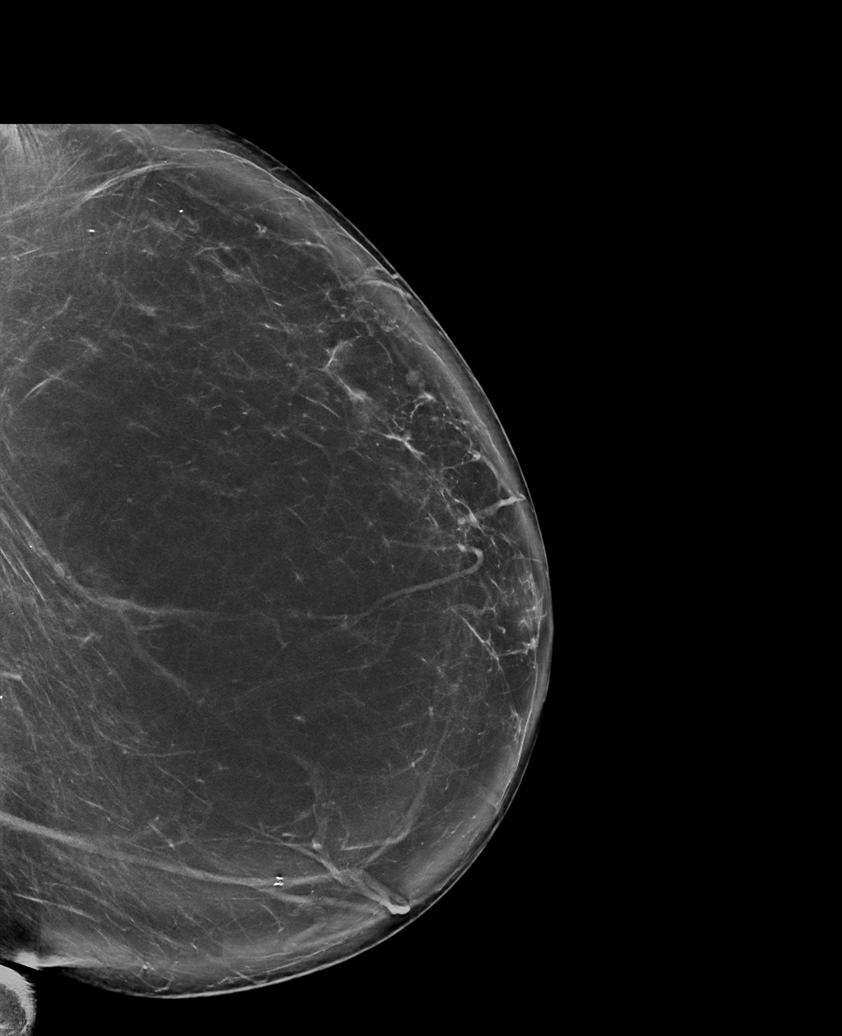

[R CC tomo · tomo slice 55/108.0]
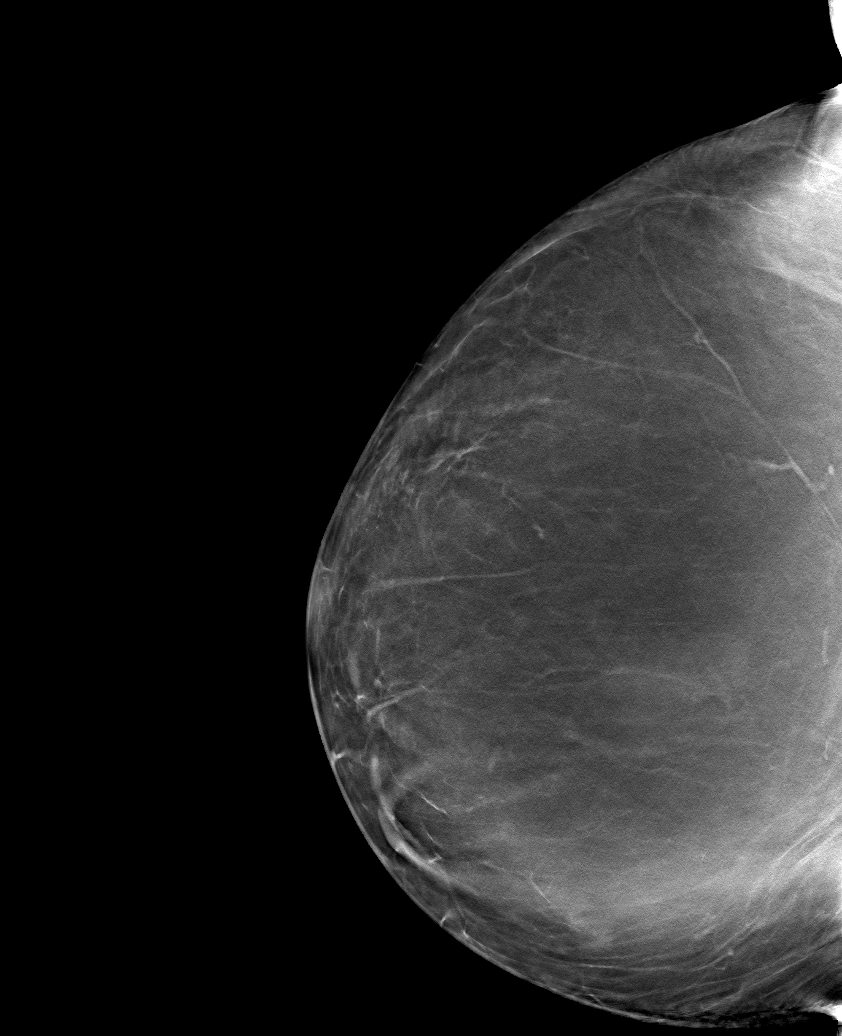

[6 of 30 positions shown; findings below may reference images not displayed]

FINDINGS: There are no findings suspicious for malignancy.
IMPRESSION: No mammographic evidence of malignancy. A result letter of this
screening mammogram will be mailed directly to the patient.

RECOMMENDATION:
Screening mammogram in one year. (Code:0E-3-N98)

BI-RADS CATEGORY  1: Negative.

## 2024-01-16 ENCOUNTER — Ambulatory Visit
Admission: RE | Admit: 2024-01-16 | Discharge: 2024-01-16 | Disposition: A | Discharge status: Home / Self Care | Source: Ambulatory Visit | Attending: Emergency Medicine | Admitting: Emergency Medicine

## 2024-01-16 DIAGNOSIS — Z1231 Encounter for screening mammogram for malignant neoplasm of breast: Secondary | ICD-10-CM

## 2024-03-11 ENCOUNTER — Ambulatory Visit: Admitting: Emergency Medicine

## 2024-03-11 ENCOUNTER — Encounter: Payer: Self-pay | Admitting: Emergency Medicine

## 2024-03-11 VITALS — BP 118/82 | HR 86 | Ht 66.0 in | Wt 228.0 lb

## 2024-03-11 DIAGNOSIS — Z1329 Encounter for screening for other suspected endocrine disorder: Secondary | ICD-10-CM

## 2024-03-11 DIAGNOSIS — Z13228 Encounter for screening for other metabolic disorders: Secondary | ICD-10-CM

## 2024-03-11 DIAGNOSIS — Z0000 Encounter for general adult medical examination without abnormal findings: Secondary | ICD-10-CM

## 2024-03-11 DIAGNOSIS — Z13220 Encounter for screening for lipoid disorders: Secondary | ICD-10-CM

## 2024-03-11 DIAGNOSIS — Z130 Encounter for screening for diseases of the blood and blood-forming organs and certain disorders involving the immune mechanism: Secondary | ICD-10-CM

## 2024-03-11 LAB — CBC WITH DIFFERENTIAL/PLATELET
Basophils Absolute: 0 10*3/uL (ref 0.0–0.1)
Basophils Relative: 0.6 % (ref 0.0–3.0)
Eosinophils Absolute: 0.1 10*3/uL (ref 0.0–0.7)
Eosinophils Relative: 2 % (ref 0.0–5.0)
HCT: 36.6 % (ref 36.0–46.0)
Hemoglobin: 12.5 g/dL (ref 12.0–15.0)
Lymphocytes Relative: 30.5 % (ref 12.0–46.0)
Lymphs Abs: 2.1 10*3/uL (ref 0.7–4.0)
MCHC: 34.1 g/dL (ref 30.0–36.0)
MCV: 90 fl (ref 78.0–100.0)
Monocytes Absolute: 0.4 10*3/uL (ref 0.1–1.0)
Monocytes Relative: 5.6 % (ref 3.0–12.0)
Neutro Abs: 4.3 10*3/uL (ref 1.4–7.7)
Neutrophils Relative %: 61.3 % (ref 43.0–77.0)
Platelets: 242 10*3/uL (ref 150.0–400.0)
RBC: 4.07 Mil/uL (ref 3.87–5.11)
RDW: 15 % (ref 11.5–15.5)
WBC: 7 10*3/uL (ref 4.0–10.5)

## 2024-03-11 LAB — COMPREHENSIVE METABOLIC PANEL WITH GFR
ALT: 7 U/L (ref 0–35)
AST: 11 U/L (ref 0–37)
Albumin: 4.1 g/dL (ref 3.5–5.2)
Alkaline Phosphatase: 60 U/L (ref 39–117)
BUN: 14 mg/dL (ref 6–23)
CO2: 30 meq/L (ref 19–32)
Calcium: 9.5 mg/dL (ref 8.4–10.5)
Chloride: 105 meq/L (ref 96–112)
Creatinine, Ser: 0.93 mg/dL (ref 0.40–1.20)
GFR: 67.61 mL/min (ref >60.00)
Glucose, Bld: 89 mg/dL (ref 70–99)
Potassium: 3.9 meq/L (ref 3.5–5.1)
Sodium: 139 meq/L (ref 135–145)
Total Bilirubin: 0.5 mg/dL (ref 0.2–1.2)
Total Protein: 7.2 g/dL (ref 6.0–8.3)

## 2024-03-11 LAB — LIPID PANEL
Cholesterol: 255 mg/dL — ABNORMAL HIGH (ref 0–200)
HDL: 47.5 mg/dL (ref >39.00)
LDL Cholesterol: 165 mg/dL — ABNORMAL HIGH (ref 0–99)
NonHDL: 207.13
Total CHOL/HDL Ratio: 5
Triglycerides: 210 mg/dL — ABNORMAL HIGH (ref 0.0–149.0)
VLDL: 42 mg/dL — ABNORMAL HIGH (ref 0.0–40.0)

## 2024-03-11 LAB — HEMOGLOBIN A1C: Hgb A1c MFr Bld: 5.1 % (ref 4.6–6.5)

## 2024-03-11 NOTE — Progress Notes (Signed)
 Dana Reese 58 y.o.   Chief Complaint  Patient presents with   Annual Exam    Patient here for physical w/ no other concerns     HISTORY OF PRESENT ILLNESS: This is a 58 y.o. female here for annual exam. Overall doing well.  Has no complaints or medical concerns today.  HPI   Prior to Admission medications   Medication Sig Start Date End Date Taking? Authorizing Provider  lisinopril -hydrochlorothiazide  (ZESTORETIC ) 20-12.5 MG tablet TAKE 1 TABLET BY MOUTH DAILY 02/04/23 03/11/24 Yes Tran Arzuaga Jose, MD  Fluocinolone Acetonide Scalp 0.01 % OIL Apply topically daily as needed. Patient not taking: Reported on 03/11/2024 04/25/22   [provider]  ibuprofen  (MOTRIN  IB) 200 MG tablet Take 2 tablets (400 mg total) by mouth every 6 (six) hours as needed. Patient not taking: Reported on 03/11/2024 07/31/22   Raynell Caller, MD  Multiple Vitamin (MULTIVITAMIN) capsule Take 1 capsule by mouth daily. Patient not taking: Reported on 03/11/2024    [provider]  rosuvastatin  (CRESTOR ) 10 MG tablet Take 1 tablet (10 mg total) by mouth daily. Patient not taking: Reported on 03/11/2024 12/26/21   Nyquan Selbe Jose, MD  Semaglutide -Weight Management (WEGOVY ) 0.25 MG/0.5ML SOAJ Inject 0.25 mg into the skin once a week. Patient not taking: Reported on 03/11/2024 09/06/23     Semaglutide -Weight Management (WEGOVY ) 0.25 MG/0.5ML SOAJ Inject 0.5 mg into the skin once a week. Patient not taking: Reported on 03/11/2024 11/04/23     Semaglutide -Weight Management (WEGOVY ) 0.5 MG/0.5ML SOAJ Inject 1 mg into the skin once a week. Patient not taking: Reported on 03/11/2024 11/05/23     Semaglutide -Weight Management (WEGOVY ) 0.5 MG/0.5ML SOAJ Inject 0.5 mg into the skin once a week. Patient not taking: Reported on 03/11/2024 11/06/23       No Known Allergies  Patient Active Problem List   Diagnosis Date Noted   Degenerative disc disease, cervical 11/16/2017   VAIN II (vaginal  intraepithelial neoplasia grade II) 10/31/2016   LGSIL on Pap smear of cervix 09/06/2016   Endometrial polyp 10/04/2015   Perimenopause 10/04/2015   Macromastia 07/14/2015   Essential hypertension, benign 10/24/2012   Family history of colon cancer 10/24/2012   Family history of breast cancer in first degree relative 10/24/2012    Past Medical History:  Diagnosis Date   Abnormal uterine bleeding (AUB)    Endometrial polyp    History of abnormal cervical Pap smear    w/ positive HPV   History of cervical dysplasia    CIN 1   History of chlamydia 2012   History of vaginal dysplasia 09/2016   s/p in office LEEP;   VAIN II and 08/ 2018  VAIN 2   Hyperlipidemia    Hypertension    Wears glasses     Past Surgical History:  Procedure Laterality Date   BREAST REDUCTION SURGERY Bilateral 01/29/2018   @[facility]  by dr d. Hobart Lulas   COLONOSCOPY  2013   COLPOSCOPY N/A 07/31/2022   Procedure: VULVAR COLPOSCOPY;  Surgeon: Raynell Caller, MD;  Location: [facility];  Service: Gynecology;  Laterality: N/A;   DILATATION & CURETTAGE/HYSTEROSCOPY WITH MYOSURE N/A 07/31/2022   Procedure: DILATATION & CURETTAGE/HYSTEROSCOPY MYOSURE, Vaginal myomectomy, Polypectomy;  Surgeon: Raynell Caller, MD;  Location: [facility];  Service: Gynecology;  Laterality: N/A;   REDUCTION MAMMAPLASTY  2019   TUBAL LIGATION Bilateral 1999   PP[facility]    Social History   Socioeconomic History   Marital status: Single  Spouse name: Not on file   Number of children: Not on file   Years of education: Not on file   Highest education level: Not on file  Occupational History   Not on file  Tobacco Use   Smoking status: Never   Smokeless tobacco: Never  Vaping Use   Vaping status: Never Used  Substance and Sexual Activity   Alcohol use: Yes    Comment: occasional   Drug use: Never   Sexual activity: Yes    Partners: Male    Birth control/protection: Surgical, Post-menopausal  Other  Topics Concern   Not on file  Social History Narrative   Marital status: single; dating seriously x 10 years; happy; no abuse      Children: 2 (33, 26)  One grandchild      Lives: with daughter, brother, grandchild      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      Tobacco: none       Alcohol: none      Drugs: none      Exercise:  None in 2018      Sexually activity: yes; history of Chlamydia 2012.        Seatbelt: 100% of time.      Guns: none   Social Drivers of Corporate investment banker Strain: Not on file  Food Insecurity: No Food Insecurity (09/20/2020)   Hunger Vital Sign    Worried About Running Out of Food in the Last Year: Never true    Ran Out of Food in the Last Year: Never true  Transportation Needs: No Transportation Needs (09/20/2020)   PRAPARE - Administrator, Civil Service (Medical): No    Lack of Transportation (Non-Medical): No  Physical Activity: Not on file  Stress: Not on file  Social Connections: Not on file  Intimate Partner Violence: Not on file    Family History  Problem Relation Age of Onset   Colon cancer Mother 58       colon cancer   Hypertension Mother    Hyperlipidemia Mother    Breast cancer Mother 66       Breast cancer   Bladder Cancer Mother 10   Colon cancer Father 74   Diabetes Daughter    Breast cancer Maternal Aunt 82   Colon cancer Maternal Grandmother    Heart disease Maternal Grandfather    Esophageal cancer Neg Hx    Liver cancer Neg Hx    Pancreatic cancer Neg Hx    Rectal cancer Neg Hx    Stomach cancer Neg Hx      Review of Systems  Constitutional: Negative.  Negative for chills and fever.  HENT: Negative.  Negative for congestion and sore throat.   Respiratory: Negative.  Negative for cough and shortness of breath.   Cardiovascular: Negative.  Negative for chest pain and palpitations.  Gastrointestinal:  Negative for abdominal pain, diarrhea, nausea and  vomiting.  Genitourinary: Negative.  Negative for dysuria and hematuria.  Skin: Negative.  Negative for rash.  Neurological: Negative.  Negative for dizziness and headaches.  All other systems reviewed and are negative.   Vitals:   03/11/24 1525  BP: 118/82  Pulse: 86  SpO2: 98%    Physical Exam Vitals reviewed.  Constitutional:      Appearance: Normal appearance.  HENT:     Head: Normocephalic.     Right Ear: Tympanic membrane, ear canal and external ear normal.  Left Ear: Tympanic membrane, ear canal and external ear normal.     Mouth/Throat:     Mouth: Mucous membranes are moist.     Pharynx: Oropharynx is clear.   Eyes:     Extraocular Movements: Extraocular movements intact.     Conjunctiva/sclera: Conjunctivae normal.     Pupils: Pupils are equal, round, and reactive to light.    Cardiovascular:     Rate and Rhythm: Normal rate and regular rhythm.     Pulses: Normal pulses.     Heart sounds: Normal heart sounds.  Pulmonary:     Effort: Pulmonary effort is normal.     Breath sounds: Normal breath sounds.  Abdominal:     Palpations: Abdomen is soft.     Tenderness: There is no abdominal tenderness.   Musculoskeletal:     Cervical back: No tenderness.     Right lower leg: No edema.     Left lower leg: No edema.  Lymphadenopathy:     Cervical: No cervical adenopathy.   Skin:    General: Skin is warm and dry.     Capillary Refill: Capillary refill takes less than 2 seconds.   Neurological:     General: No focal deficit present.     Mental Status: She is alert and oriented to person, place, and time.   Psychiatric:        Mood and Affect: Mood normal.        Behavior: Behavior normal.      ASSESSMENT & PLAN: Problem List Items Addressed This Visit   None Visit Diagnoses       Routine general medical examination at a health care facility    -  Primary   Relevant Orders   CBC with Differential/Platelet   Comprehensive metabolic panel with GFR    Hemoglobin A1c   Lipid panel     Screening for deficiency anemia       Relevant Orders   CBC with Differential/Platelet     Screening for lipoid disorders       Relevant Orders   Lipid panel     Screening for endocrine, metabolic and immunity disorder       Relevant Orders   Comprehensive metabolic panel with GFR   Hemoglobin A1c      Modifiable risk factors discussed with patient. Anticipatory guidance according to age provided. The following topics were also discussed: Social Determinants of Health Smoking.  Non-smoker Diet and nutrition Benefits of exercise Cancer screening and review of most recent colonoscopy and mammogram reports Vaccinations review and recommendations Cardiovascular risk assessment and need for blood work The 10-year ASCVD risk score (Arnett DK, et al., 2019) is: 6%   Values used to calculate the score:     Age: 56 years     Clincally relevant sex: Female     Is Non-Hispanic African American: Yes     Diabetic: No     Tobacco smoker: No     Systolic Blood Pressure: 118 mmHg     Is BP treated: Yes     HDL Cholesterol: 49.5 mg/dL     Total Cholesterol: 240 mg/dL  Mental health including depression and anxiety Fall and accident prevention  Patient Instructions  Health Maintenance, Female Adopting a healthy lifestyle and getting preventive care are important in promoting health and wellness. Ask your health care provider about: The right schedule for you to have regular tests and exams. Things you can do on your own to prevent diseases and keep  yourself healthy. What should I know about diet, weight, and exercise? Eat a healthy diet  Eat a diet that includes plenty of vegetables, fruits, low-fat dairy products, and lean protein. Do not eat a lot of foods that are high in solid fats, added sugars, or sodium. Maintain a healthy weight Body mass index (BMI) is used to identify weight problems. It estimates body fat based on height and weight. Your  health care provider can help determine your BMI and help you achieve or maintain a healthy weight. Get regular exercise Get regular exercise. This is one of the most important things you can do for your health. Most adults should: Exercise for at least 150 minutes each week. The exercise should increase your heart rate and make you sweat (moderate-intensity exercise). Do strengthening exercises at least twice a week. This is in addition to the moderate-intensity exercise. Spend less time sitting. Even light physical activity can be beneficial. Watch cholesterol and blood lipids Have your blood tested for lipids and cholesterol at 59 years of age, then have this test every 5 years. Have your cholesterol levels checked more often if: Your lipid or cholesterol levels are high. You are older than 59 years of age. You are at high risk for heart disease. What should I know about cancer screening? Depending on your health history and family history, you may need to have cancer screening at various ages. This may include screening for: Breast cancer. Cervical cancer. Colorectal cancer. Skin cancer. Lung cancer. What should I know about heart disease, diabetes, and high blood pressure? Blood pressure and heart disease High blood pressure causes heart disease and increases the risk of stroke. This is more likely to develop in people who have high blood pressure readings or are overweight. Have your blood pressure checked: Every 3-5 years if you are 53-20 years of age. Every year if you are 31 years old or older. Diabetes Have regular diabetes screenings. This checks your fasting blood sugar level. Have the screening done: Once every three years after age 50 if you are at a normal weight and have a low risk for diabetes. More often and at a younger age if you are overweight or have a high risk for diabetes. What should I know about preventing infection? Hepatitis B If you have a higher risk for  hepatitis B, you should be screened for this virus. Talk with your health care provider to find out if you are at risk for hepatitis B infection. Hepatitis C Testing is recommended for: Everyone born from [birth year] through 1966. Anyone with known risk factors for hepatitis C. Sexually transmitted infections (STIs) Get screened for STIs, including gonorrhea and chlamydia, if: You are sexually active and are younger than 59 years of age. You are older than 59 years of age and your health care provider tells you that you are at risk for this type of infection. Your sexual activity has changed since you were last screened, and you are at increased risk for chlamydia or gonorrhea. Ask your health care provider if you are at risk. Ask your health care provider about whether you are at high risk for HIV. Your health care provider may recommend a prescription medicine to help prevent HIV infection. If you choose to take medicine to prevent HIV, you should first get tested for HIV. You should then be tested every 3 months for as long as you are taking the medicine. Pregnancy If you are about to stop having your period (premenopausal) and  you may become pregnant, seek counseling before you get pregnant. Take 400 to 800 micrograms (mcg) of folic acid every day if you become pregnant. Ask for birth control (contraception) if you want to prevent pregnancy. Osteoporosis and menopause Osteoporosis is a disease in which the bones lose minerals and strength with aging. This can result in bone fractures. If you are 57 years old or older, or if you are at risk for osteoporosis and fractures, ask your health care provider if you should: Be screened for bone loss. Take a calcium  or vitamin D  supplement to lower your risk of fractures. Be given hormone replacement therapy (HRT) to treat symptoms of menopause. Follow these instructions at home: Alcohol use Do not drink alcohol if: Your health care provider tells you not  to drink. You are pregnant, may be pregnant, or are planning to become pregnant. If you drink alcohol: Limit how much you have to: 0-1 drink a day. Know how much alcohol is in your drink. In the [place] one drink equals one 12 oz bottle of beer (355 mL), one 5 oz glass of wine (148 mL), or one 1 oz glass of hard liquor (44 mL). Lifestyle Do not use any products that contain nicotine or tobacco. These products include cigarettes, chewing tobacco, and vaping devices, such as e-cigarettes. If you need help quitting, ask your health care provider. Do not use street drugs. Do not share needles. Ask your health care provider for help if you need support or information about quitting drugs. General instructions Schedule regular health, dental, and eye exams. Stay current with your vaccines. Tell your health care provider if: You often feel depressed. You have ever been abused or do not feel safe at home. Summary Adopting a healthy lifestyle and getting preventive care are important in promoting health and wellness. Follow your health care provider's instructions about healthy diet, exercising, and getting tested or screened for diseases. Follow your health care provider's instructions on monitoring your cholesterol and blood pressure. This information is not intended to replace advice given to you by your health care provider. Make sure you discuss any questions you have with your health care provider. Document Revised: 01/29/2021 Document Reviewed: 01/29/2021 Elsevier Patient Education  2024 Elsevier Inc.     Maryagnes Small, MD [facility]

## 2024-03-11 NOTE — Patient Instructions (Signed)

## 2024-03-12 ENCOUNTER — Ambulatory Visit: Payer: Self-pay | Admitting: Emergency Medicine

## 2024-03-18 ENCOUNTER — Telehealth: Payer: Self-pay | Admitting: Emergency Medicine

## 2024-03-18 DIAGNOSIS — I10 Essential (primary) hypertension: Secondary | ICD-10-CM

## 2024-03-18 NOTE — Telephone Encounter (Signed)
 Copied from CRM [id number]. Topic: Clinical - Medication Refill >> Mar 18, 2024  8:46 AM Ernestene P wrote: Medication: lisinopril -hydrochlorothiazide  (ZESTORETIC ) 20-12.5 MG tablet  Has the patient contacted their pharmacy? Yes (Agent: If no, request that the patient contact the pharmacy for the refill. If patient does not wish to contact the pharmacy document the reason why and proceed with request.) (Agent: If yes, when and what did the pharmacy advise?)  This is the patient's preferred pharmacy:  [facility] STORE #[id number] [facility], [place] - [address] AT [facility] [address] [place] [address] Phone: [phone] Fax: [phone]  Is this the correct pharmacy for this prescription? Yes If no, delete pharmacy and type the correct one.   Has the prescription been filled recently? No  Is the patient out of the medication? has 4 pills left  Has the patient been seen for an appointment in the last year OR does the patient have an upcoming appointment? Yes  Can we respond through MyChart? Yes  Agent: Please be advised that Rx refills may take up to 3 business days. We ask that you follow-up with your pharmacy.

## 2024-03-19 MED ORDER — LISINOPRIL-HYDROCHLOROTHIAZIDE 20-12.5 MG PO TABS: 1.0000 | ORAL_TABLET | Freq: Every day | ORAL | 3 refills | Status: AC

## 2024-03-27 ENCOUNTER — Other Ambulatory Visit (HOSPITAL_COMMUNITY): Payer: Self-pay

## 2024-03-27 MED ORDER — WEGOVY 1 MG/0.5ML ~~LOC~~ SOAJ
0.5000 mL | SUBCUTANEOUS | 1 refills | Status: AC
  Filled 2024-03-27 – 2024-04-03 (×5): qty 2, 28d supply, fill #0
  Filled 2024-07-10: qty 2, 28d supply, fill #1

## 2024-03-29 ENCOUNTER — Telehealth (HOSPITAL_COMMUNITY): Payer: Self-pay

## 2024-03-29 ENCOUNTER — Other Ambulatory Visit (HOSPITAL_BASED_OUTPATIENT_CLINIC_OR_DEPARTMENT_OTHER): Payer: Self-pay

## 2024-03-29 ENCOUNTER — Other Ambulatory Visit (HOSPITAL_COMMUNITY): Payer: Self-pay

## 2024-03-30 ENCOUNTER — Other Ambulatory Visit: Payer: Self-pay

## 2024-03-30 ENCOUNTER — Other Ambulatory Visit (HOSPITAL_COMMUNITY): Payer: Self-pay

## 2024-03-30 NOTE — Telephone Encounter (Signed)
 PA request has been Received. New Encounter has been or will be created for follow up. For additional info see Pharmacy Prior Auth telephone encounter from 03/30/24.

## 2024-04-03 ENCOUNTER — Other Ambulatory Visit (HOSPITAL_COMMUNITY): Payer: Self-pay

## 2024-04-07 ENCOUNTER — Other Ambulatory Visit (HOSPITAL_COMMUNITY): Payer: Self-pay

## 2024-04-09 ENCOUNTER — Other Ambulatory Visit (HOSPITAL_COMMUNITY): Payer: Self-pay

## 2024-04-12 ENCOUNTER — Other Ambulatory Visit (HOSPITAL_COMMUNITY): Payer: Self-pay

## 2024-04-13 ENCOUNTER — Other Ambulatory Visit (HOSPITAL_COMMUNITY): Payer: Self-pay

## 2024-04-17 ENCOUNTER — Other Ambulatory Visit (HOSPITAL_COMMUNITY): Payer: Self-pay

## 2024-04-27 ENCOUNTER — Other Ambulatory Visit (HOSPITAL_COMMUNITY): Payer: Self-pay

## 2024-07-20 ENCOUNTER — Other Ambulatory Visit: Payer: Self-pay

## 2024-07-20 ENCOUNTER — Other Ambulatory Visit (HOSPITAL_COMMUNITY): Payer: Self-pay

## 2025-03-17 ENCOUNTER — Encounter: Admitting: Emergency Medicine
